# Patient Record
Sex: Female | Born: 1965 | Race: White | Hispanic: No | Marital: Married | Smoking: Former smoker
Health system: Southern US, Community
[De-identification: ages and names within clinical notes are randomized; demographics above are authoritative.]

## PROBLEM LIST (undated history)

## (undated) DIAGNOSIS — G43909 Migraine, unspecified, not intractable, without status migrainosus: Secondary | ICD-10-CM

## (undated) DIAGNOSIS — J45909 Unspecified asthma, uncomplicated: Secondary | ICD-10-CM

## (undated) DIAGNOSIS — K769 Liver disease, unspecified: Secondary | ICD-10-CM

## (undated) DIAGNOSIS — I700 Atherosclerosis of aorta: Secondary | ICD-10-CM

## (undated) DIAGNOSIS — I10 Essential (primary) hypertension: Secondary | ICD-10-CM

## (undated) DIAGNOSIS — T7840XA Allergy, unspecified, initial encounter: Secondary | ICD-10-CM

## (undated) DIAGNOSIS — Z86718 Personal history of other venous thrombosis and embolism: Secondary | ICD-10-CM

## (undated) DIAGNOSIS — E785 Hyperlipidemia, unspecified: Secondary | ICD-10-CM

## (undated) DIAGNOSIS — F419 Anxiety disorder, unspecified: Secondary | ICD-10-CM

## (undated) DIAGNOSIS — K219 Gastro-esophageal reflux disease without esophagitis: Secondary | ICD-10-CM

## (undated) DIAGNOSIS — N390 Urinary tract infection, site not specified: Secondary | ICD-10-CM

## (undated) HISTORY — DX: Anxiety disorder, unspecified: F41.9

## (undated) HISTORY — DX: Urinary tract infection, site not specified: N39.0

## (undated) HISTORY — DX: Personal history of other venous thrombosis and embolism: Z86.718

## (undated) HISTORY — DX: Migraine, unspecified, not intractable, without status migrainosus: G43.909

## (undated) HISTORY — DX: Allergy, unspecified, initial encounter: T78.40XA

---

## 2004-06-29 ENCOUNTER — Ambulatory Visit: Payer: Self-pay | Admitting: Gastroenterology

## 2004-11-24 ENCOUNTER — Ambulatory Visit: Payer: Self-pay | Admitting: Internal Medicine

## 2004-12-07 ENCOUNTER — Ambulatory Visit: Payer: Self-pay | Admitting: Obstetrics and Gynecology

## 2005-12-08 ENCOUNTER — Ambulatory Visit: Payer: Self-pay | Admitting: Obstetrics and Gynecology

## 2006-07-20 ENCOUNTER — Ambulatory Visit: Payer: Self-pay | Admitting: Internal Medicine

## 2006-07-20 ENCOUNTER — Inpatient Hospital Stay: Payer: Self-pay | Admitting: Internal Medicine

## 2006-07-22 ENCOUNTER — Ambulatory Visit: Payer: Self-pay | Admitting: Internal Medicine

## 2006-08-16 ENCOUNTER — Ambulatory Visit: Payer: Self-pay | Admitting: Internal Medicine

## 2006-08-20 ENCOUNTER — Ambulatory Visit: Payer: Self-pay | Admitting: Internal Medicine

## 2006-12-27 ENCOUNTER — Ambulatory Visit: Payer: Self-pay | Admitting: Obstetrics and Gynecology

## 2007-10-19 ENCOUNTER — Emergency Department: Payer: Self-pay | Admitting: Internal Medicine

## 2007-10-19 ENCOUNTER — Other Ambulatory Visit: Payer: Self-pay

## 2008-01-03 ENCOUNTER — Ambulatory Visit: Payer: Self-pay | Admitting: Obstetrics and Gynecology

## 2008-07-18 ENCOUNTER — Ambulatory Visit: Payer: Self-pay | Admitting: Internal Medicine

## 2008-11-22 IMAGING — CT CT ABDOMEN WO/W CM
3 of 5 series · 14 of 32 positions shown, 19 images · non-contrast
Comparison: none

REASON FOR EXAM: Portal Vein thrombosis
COMMENTS:

[Series 2: soft tissue w/o · axial · non-contrast · 0.71mm/px · z∈[-786,-698]mm · 2 of 35 slices shown]
[im 12/35  soft-tissue]
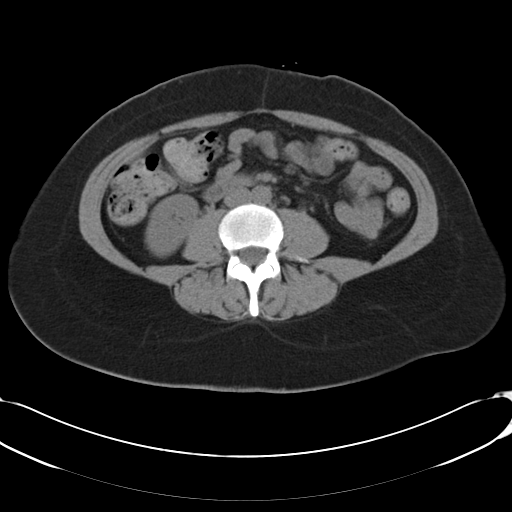
[im 23/35  soft-tissue]
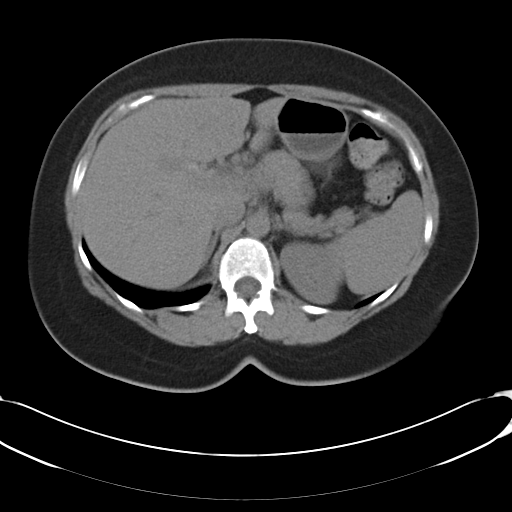

[Series 4: soft tissue with · axial · 0.71mm/px · z∈[-842,-642]mm · 6 of 56 slices shown]
[im 8/56  soft-tissue]
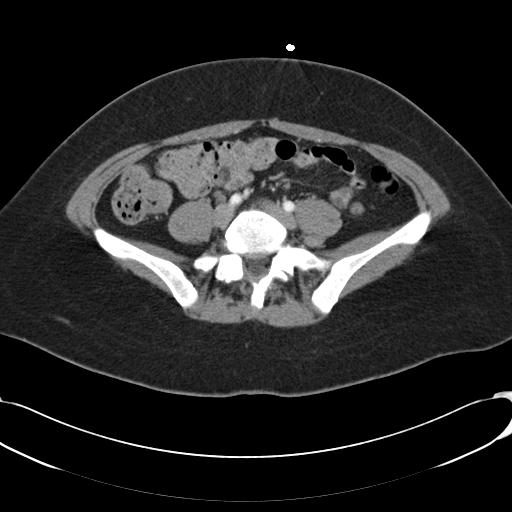
[im 16/56  soft-tissue]
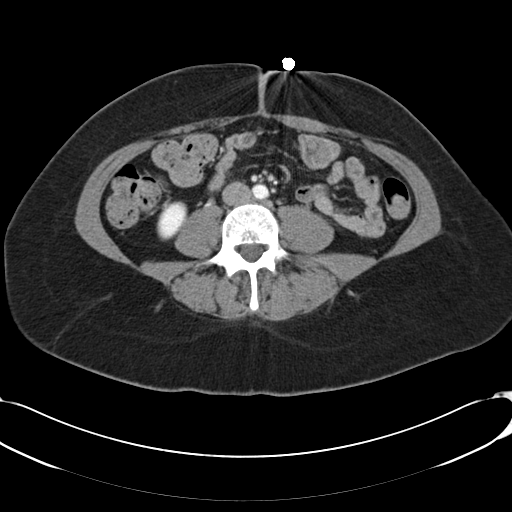
[im 24/56  soft-tissue]
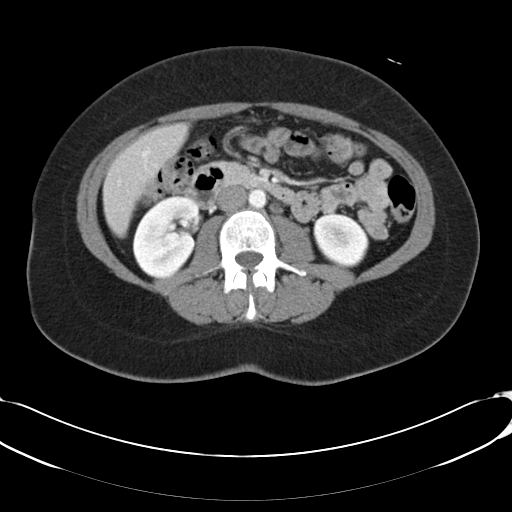
[im 32/56  soft-tissue]
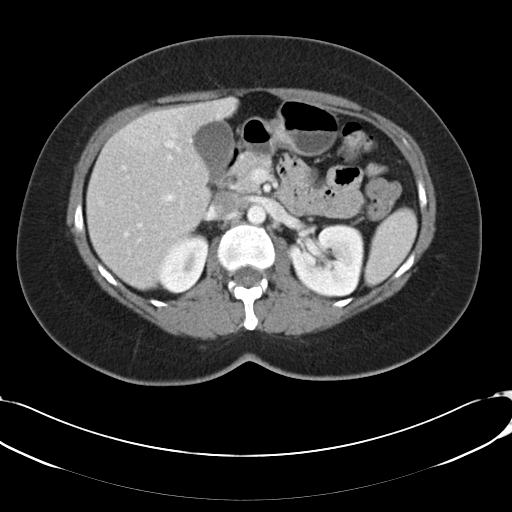
[im 40/56  soft-tissue]
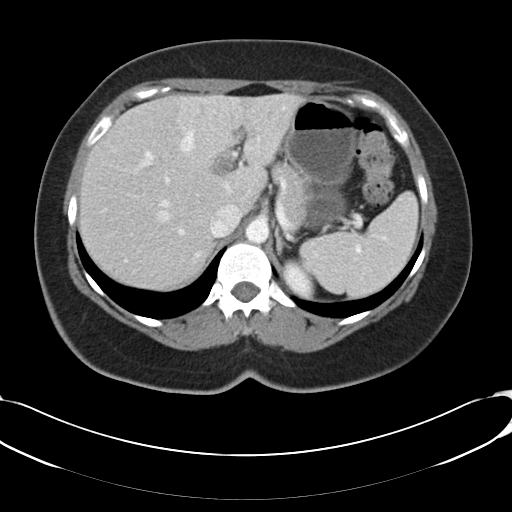
[im 48/56  soft-tissue]
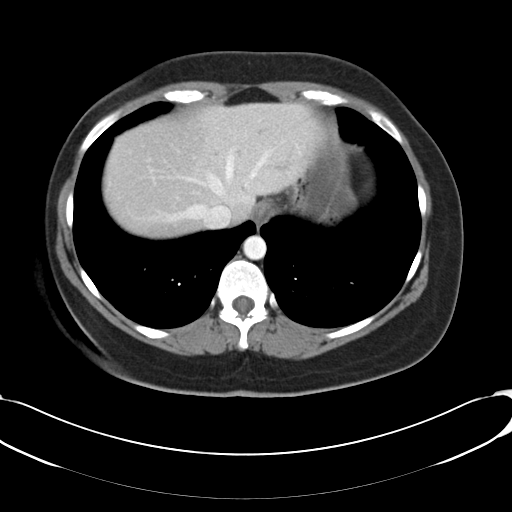

[Series 5: soft tissue with venous · axial · portal-venous · 0.71mm/px · z∈[-842,-642]mm · 6 of 56 slices shown, 11 images]
[im 8/56  soft-tissue]
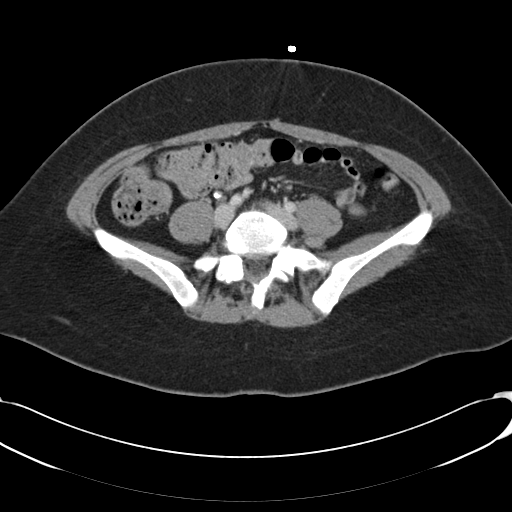
[im 8/56  bone]
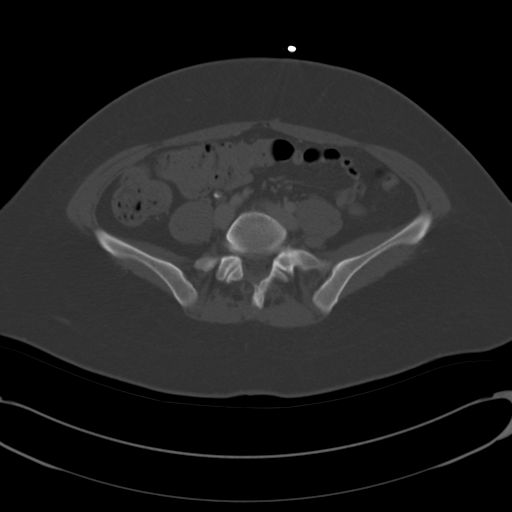
[im 16/56  soft-tissue]
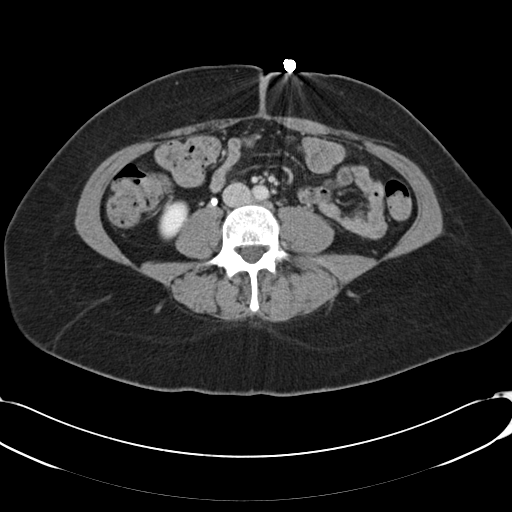
[im 24/56  soft-tissue]
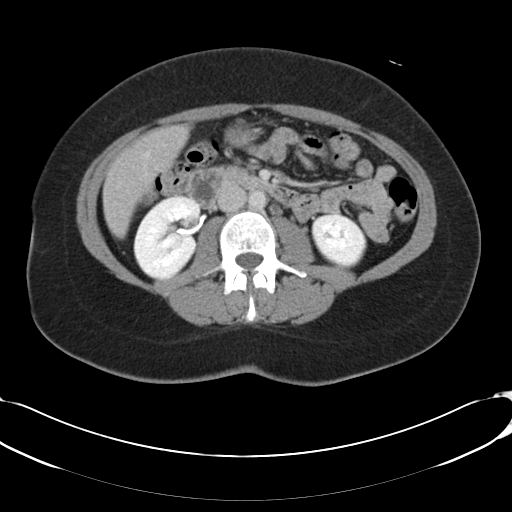
[im 24/56  lung]
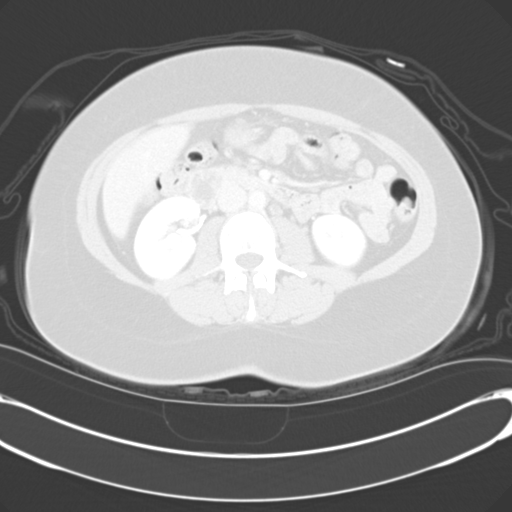
[im 32/56  soft-tissue]
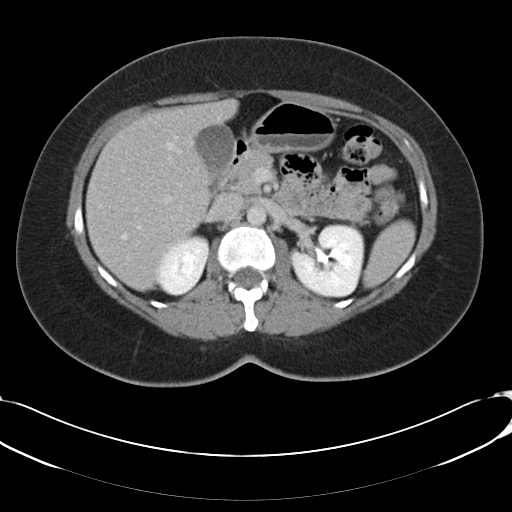
[im 32/56  lung]
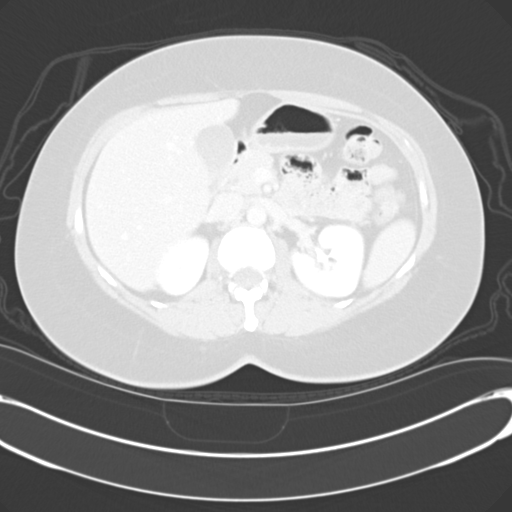
[im 40/56  soft-tissue]
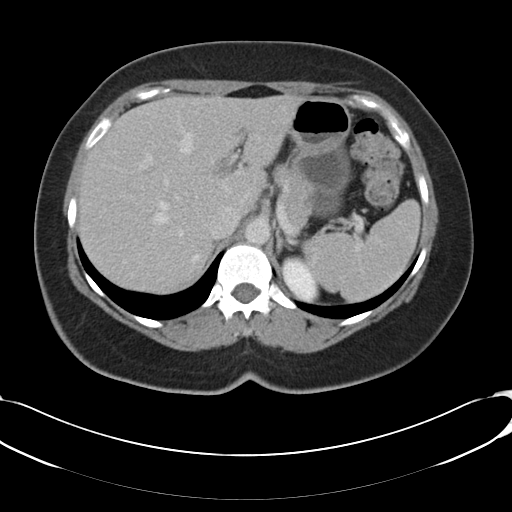
[im 40/56  lung]
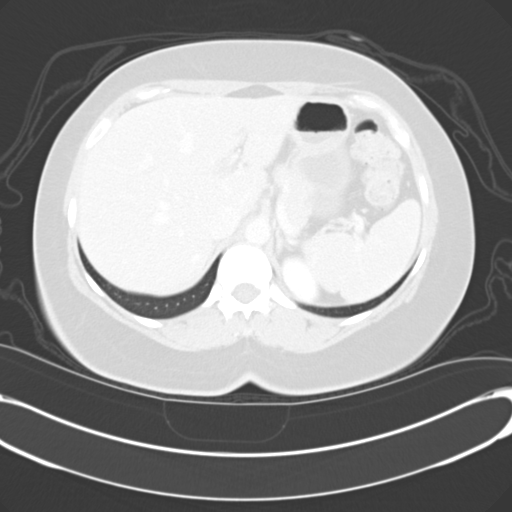
[im 48/56  soft-tissue]
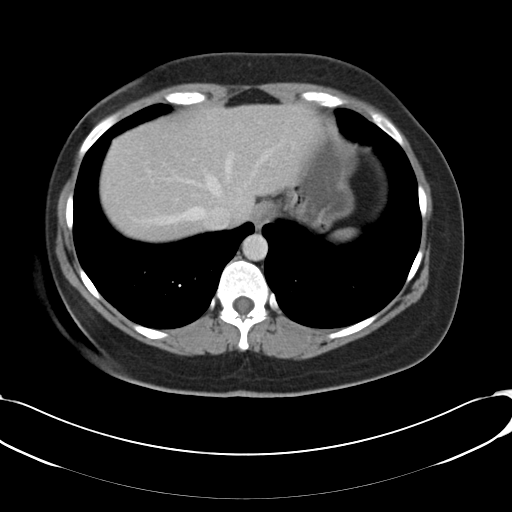
[im 48/56  lung]
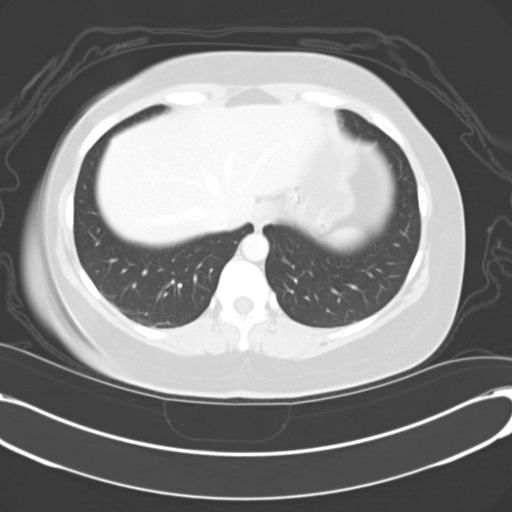

[14 of 32 positions shown; findings below may reference images not displayed]

PROCEDURE:     CT  - CT ABDOMEN STANDARD W/WO  - July 20, 2006 [DATE]

RESULT:        Axial 8 mm sections were obtained from the lung bases through
the superior iliac crest.

Evaluation of the lung bases demonstrates hypoventilation.  No further
gross abnormalities are identified.

Evaluation of the liver demonstrates no evidence of liver masses.  There
does appear to be asymmetric enhancement of the liver with the RIGHT lobe
demonstrating a slightly lower attenuation status post contrast when
compared to the LEFT. Evaluation of the portal vein demonstrates an area of
low attenuation which appears to rest along the wall of the LEFT portal vein
and extends into the intraparenchymal portions of the portal vein on the
LEFT.  The RIGHT portal vein and central portal vein appear to be opacified.
 These findings correlate with the findings on ultrasound consistent with
portal vein thrombosis.  There appears to be areas of cavernous
transformation within the porta hepatis.  No evidence of liver masses are
identified. The kidneys, spleen, adrenals and pancreas are unremarkable.
There is no evidence of abdominal free or drainable fluid collections.  The
celiac, SMA and IMA are patent.
IMPRESSION: 1.     Findings which appear to reflect the sequela of thrombus within the
LEFT branch of the portal vein as well as extending into the porta hepatis
region. There also appears to be evidence of associated cavernous
transformation.
2.     Not mentioned above, there also appears to be evidence of extension
of the region of low attenuation within the proximal portion of the RIGHT
portal vein. Interventional surgical consultation is recommended.

Dr. Blain of the Internal Medicine Service was informed of these findings at
the time of the initial interpretation.

## 2008-11-22 IMAGING — US ABDOMEN ULTRASOUND
1 series · 17 of 25 positions shown · non-contrast
Comparison: none

REASON FOR EXAM: RUQ epigastric abdominal pain
COMMENTS:

[Series 1: abdomen ultrasound · 17 of 102 slices shown]
[im 1/102]
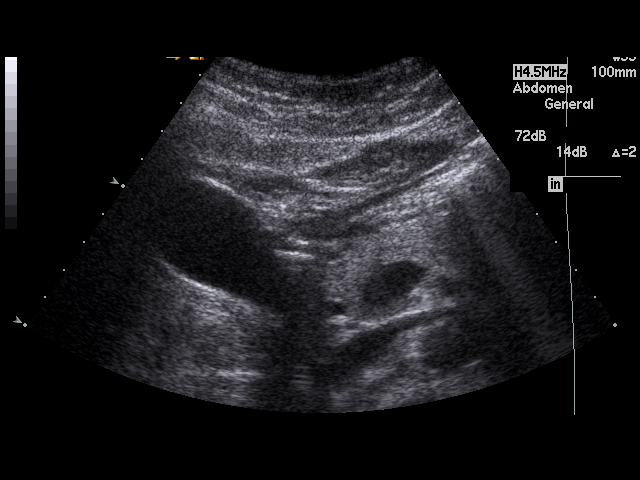
[im 9/102]
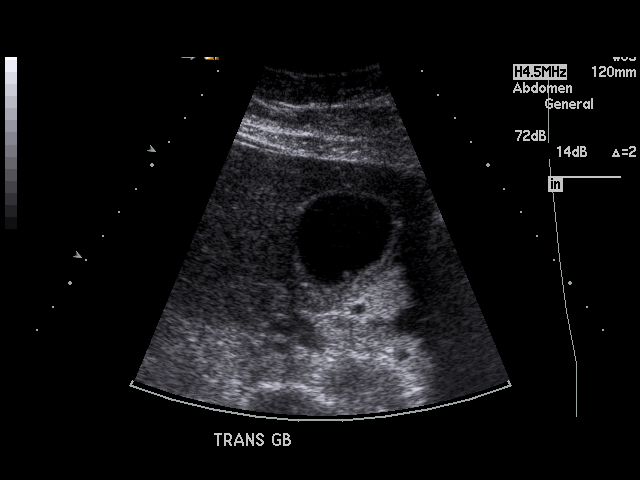
[im 13/102]
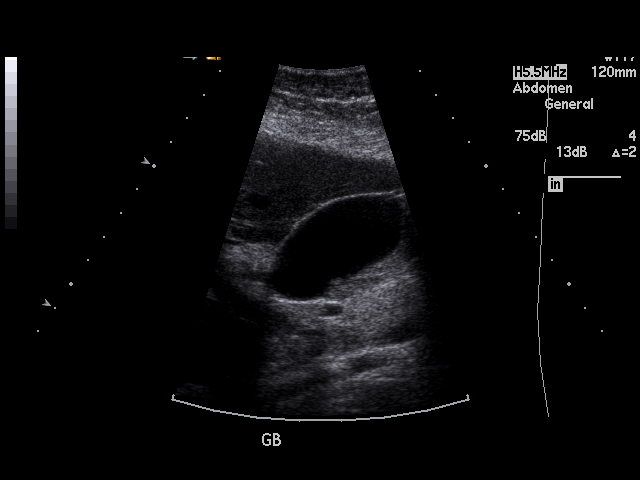
[im 22/102]
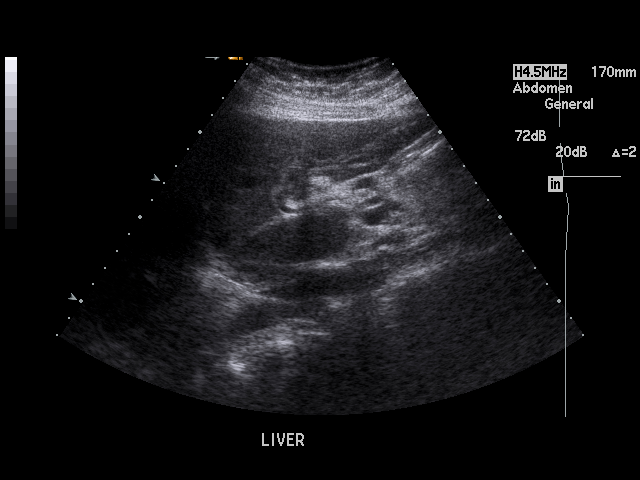
[im 26/102]
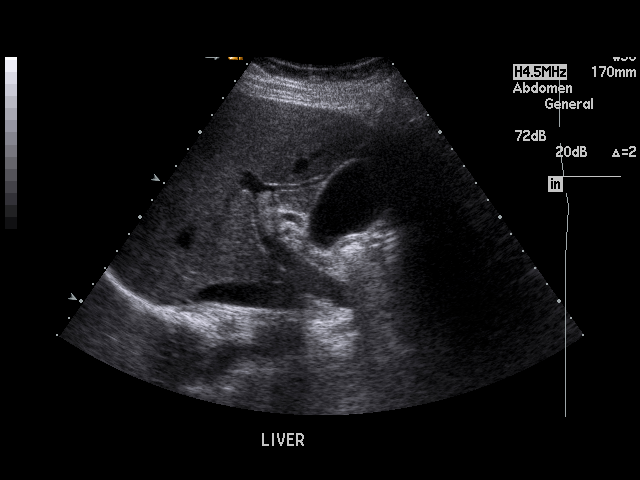
[im 34/102]
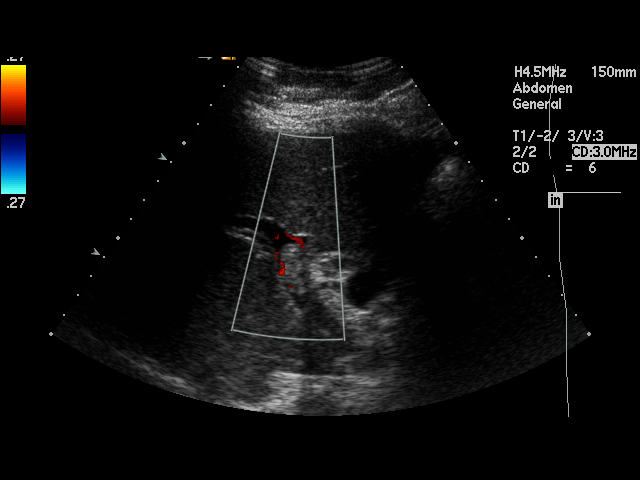
[im 38/102]
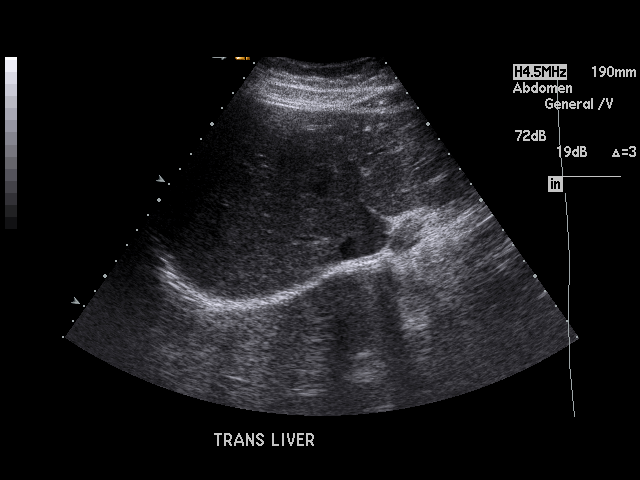
[im 47/102]
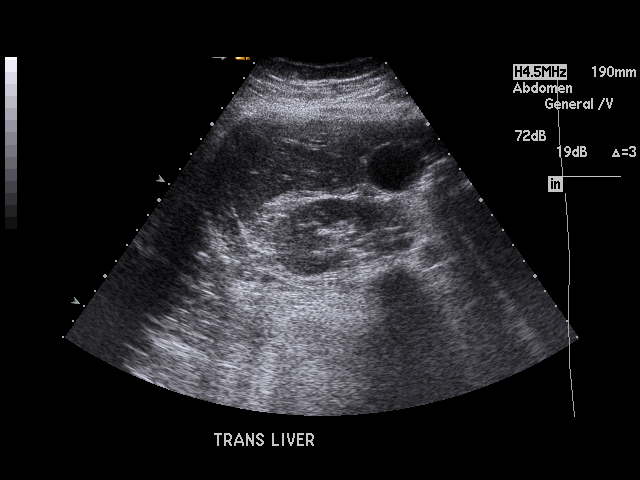
[im 51/102]
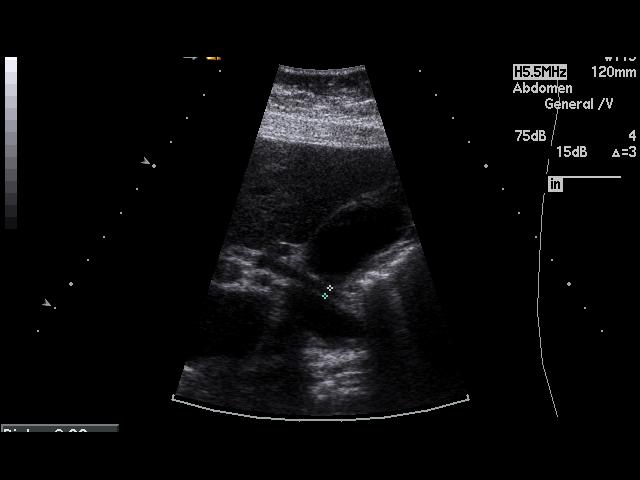
[im 55/102]
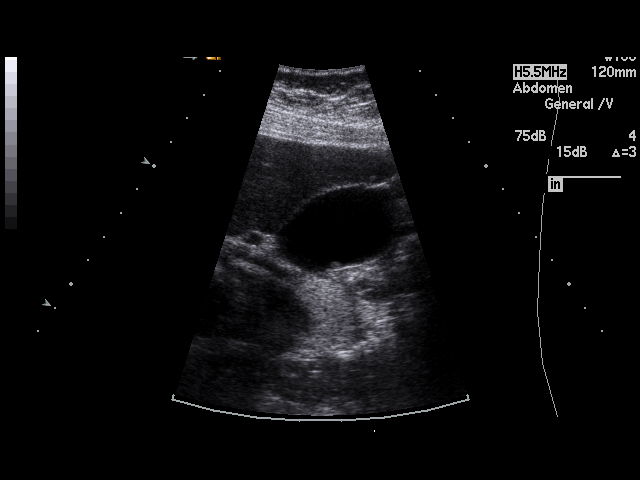
[im 64/102]
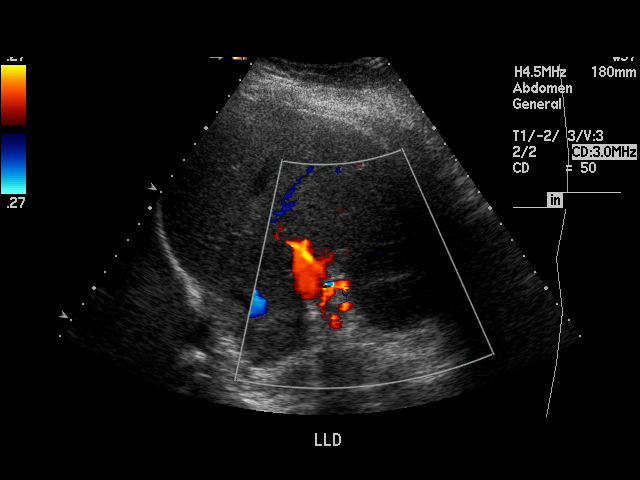
[im 68/102]
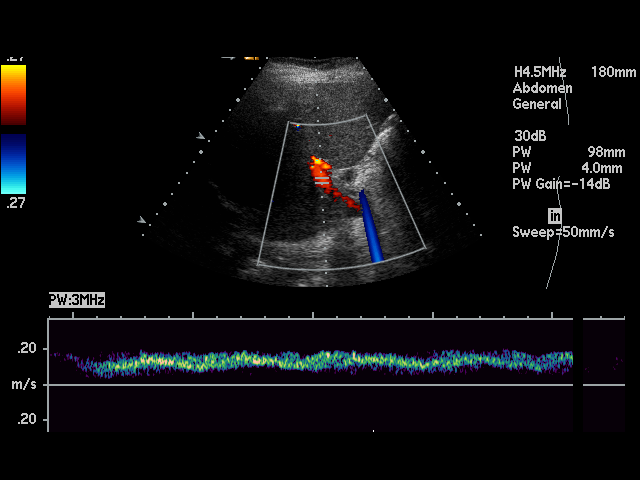
[im 76/102]
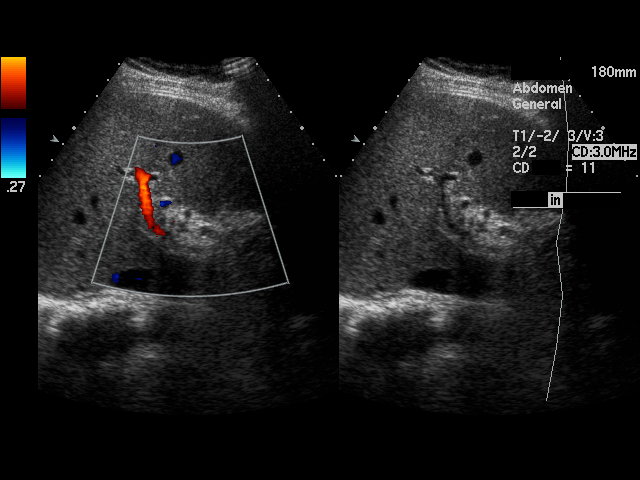
[im 80/102]
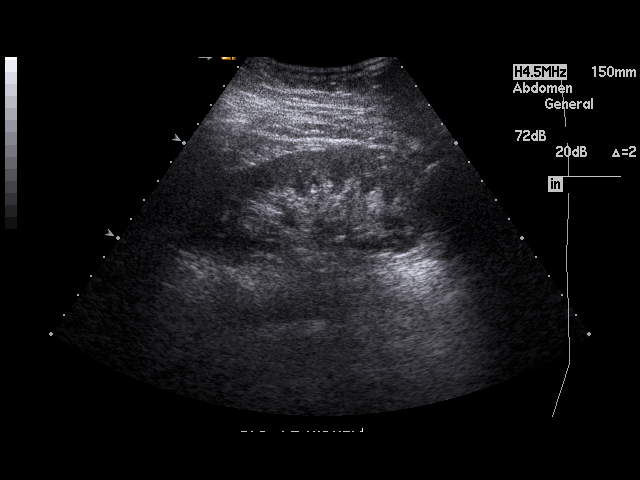
[im 89/102]
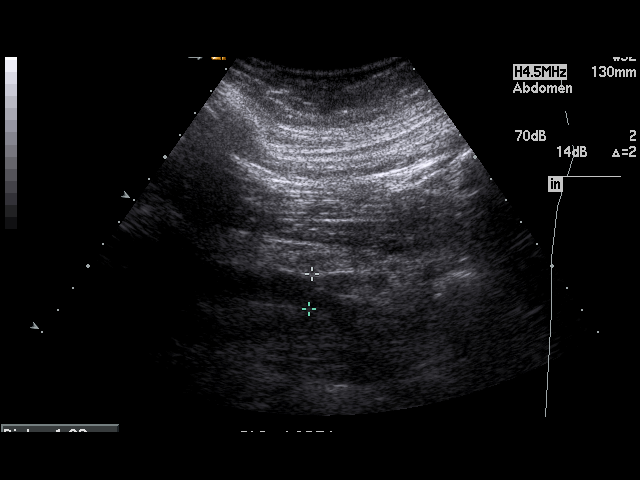
[im 93/102]
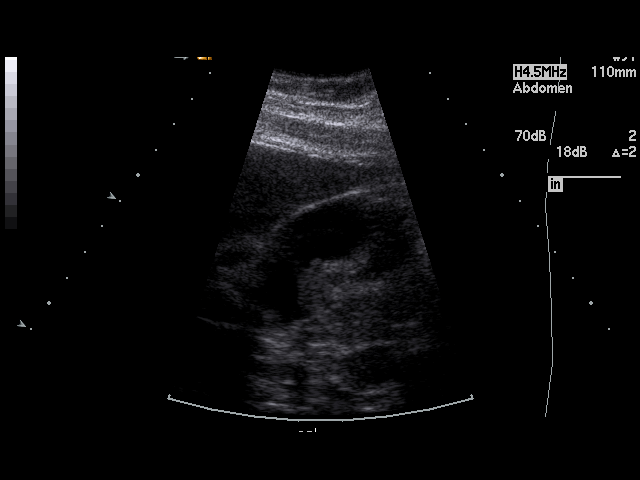
[im 102/102]
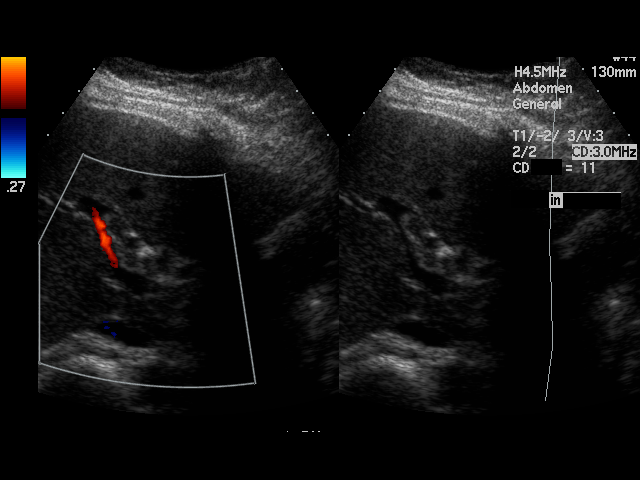

[17 of 25 positions shown; findings below may reference images not displayed]

PROCEDURE:     US  - US ABDOMEN GENERAL SURVEY  - July 20, 2006 [DATE]

RESULT:     Evaluation of the liver demonstrates no focal solid or cystic
masses or regions of abnormal echotexture. There is a region of increased
echogenicity within the midportion of the portal vein, which on Doppler flow
imaging demonstrates areas of flow passing along the periphery of this area
of increased echogenicity. This area is concerning for a possible region of
non-occlusive thrombus and further evaluation with triphasic CT to include a
portal venous phase is recommended. There also appears to be a serpiginous
area of flow adjacent to the portal vein suspicious for a region of
cavernous transformation. Evaluation of the gallbladder demonstrates a focal
small area of increased echogenicity without shadowing which is non-mobile
along the periphery of the gallbladder suspicious for a polyp.  Gallbladder
wall thickness is 1.4 mm. There is no evidence of stones nor sludging. The
common bile duct measures 4 mm in diameter. Evaluation of the aorta
demonstrates no evidence of abdominal aortic aneurysm. The aorta measures
1.6 cm in diameter. The spleen and kidneys demonstrate no sonographic
abnormality. Hepatopetal flow is demonstrated within the portal vein. The
spleen measures 10.7 cm in longitudinal dimensions. The pancreas
demonstrates no sonographic abnormalities.
IMPRESSION: 1.Findings suspicious for a region of non-occlusive thrombus within the main
portal vein as described above.  Further evaluation with triphasic CT is
recommended. These findings were telephoned to Dr. [REDACTED] at the time
of the initial interpretation.

2.No further sonographic abnormality is appreciated.

## 2009-01-14 ENCOUNTER — Ambulatory Visit: Payer: Self-pay | Admitting: Obstetrics and Gynecology

## 2009-06-24 ENCOUNTER — Ambulatory Visit: Payer: Self-pay | Admitting: General Practice

## 2010-02-19 ENCOUNTER — Ambulatory Visit: Payer: Self-pay | Admitting: Obstetrics and Gynecology

## 2010-11-29 ENCOUNTER — Ambulatory Visit: Payer: Self-pay | Admitting: Urology

## 2011-03-02 ENCOUNTER — Ambulatory Visit: Payer: Self-pay | Admitting: Obstetrics and Gynecology

## 2012-03-06 ENCOUNTER — Ambulatory Visit: Payer: Self-pay | Admitting: Obstetrics and Gynecology

## 2013-03-19 ENCOUNTER — Ambulatory Visit: Payer: Self-pay | Admitting: Obstetrics and Gynecology

## 2014-03-21 HISTORY — PX: VAGINAL HYSTERECTOMY: SUR661

## 2014-03-24 ENCOUNTER — Ambulatory Visit: Payer: Self-pay | Admitting: Obstetrics and Gynecology

## 2014-06-02 ENCOUNTER — Ambulatory Visit: Payer: Self-pay | Admitting: Family Medicine

## 2015-02-05 ENCOUNTER — Other Ambulatory Visit: Payer: Self-pay | Admitting: Obstetrics and Gynecology

## 2015-02-05 DIAGNOSIS — Z1231 Encounter for screening mammogram for malignant neoplasm of breast: Secondary | ICD-10-CM

## 2015-02-09 ENCOUNTER — Ambulatory Visit (INDEPENDENT_AMBULATORY_CARE_PROVIDER_SITE_OTHER): Payer: BC Managed Care – PPO | Admitting: Obstetrics and Gynecology

## 2015-02-09 ENCOUNTER — Encounter: Payer: Self-pay | Admitting: Obstetrics and Gynecology

## 2015-02-09 VITALS — BP 125/80 | HR 89 | Ht 60.0 in | Wt 184.4 lb

## 2015-02-09 DIAGNOSIS — Z975 Presence of (intrauterine) contraceptive device: Secondary | ICD-10-CM

## 2015-02-09 DIAGNOSIS — R3129 Other microscopic hematuria: Secondary | ICD-10-CM | POA: Diagnosis not present

## 2015-02-09 LAB — MICROSCOPIC EXAMINATION: Epithelial Cells (non renal): >10 /hpf — ABNORMAL HIGH (ref 0–10)

## 2015-02-09 LAB — URINALYSIS, COMPLETE
Bilirubin, UA: NEGATIVE
Glucose, UA: NEGATIVE
KETONES UA: NEGATIVE
Nitrite, UA: NEGATIVE
UUROB: 0.2 mg/dL (ref 0.2–1.0)
pH, UA: 5.5 (ref 5.0–7.5)

## 2015-02-09 NOTE — Patient Instructions (Signed)
CT Scan A computed tomography (CT) scan is a specialized X-ray scan. It uses X-rays and a computer to make pictures of different areas of your body. A CT scan can offer more detailed information than a regular X-ray exam. The CT scan provides data about internal organs, soft tissue structures, blood vessels, and bones.  The CT scanner is a large machine that takes pictures of your body as you move through the opening.  LET YOUR HEALTH CARE PROVIDER KNOW ABOUT:  Any allergies you have.   All medicines you are taking, including vitamins, herbs, eye drops, creams, and over-the-counter medicines.   Previous problems you or members of your family have had with the use of anesthetics.   Any blood disorders you have.   Previous surgeries you have had.   Medical conditions you have. RISKS AND COMPLICATIONS  Generally, this is a safe procedure. However, as with any procedure, problems can occur. Possible problems include:   An allergic reaction to the contrast material.   Development of cancer from excessive exposure to radiation. The risk of this is small.  BEFORE THE PROCEDURE   The day before the test, stop drinking caffeinated beverages. These include energy drinks, tea, soda, coffee, and hot chocolate.   On the day of the test:  About 4 hours before the test, stop eating and drinking anything but water as advised by your health care provider.   Avoid wearing jewelry. You will have to partly or fully undress and wear a hospital gown. PROCEDURE   You will be asked to lie on a table with your arms above your head.   If contrast dye is to be used for the test, an IV tube will be inserted in your arm. The contrast dye will be injected into the IV tube. You might feel warm, or you may get a metallic taste in your mouth.   The table you will be lying on will move into a large machine that will do the scanning.   You will be able to see, hear, and talk to the person running the  machine while you are in it. Follow that person's directions.   The CT machine will move around you to take pictures. Do not move while it is scanning. This helps to get a good image.   When the best possible pictures have been taken, the machine will be turned off. The table will be moved out of the machine. The IV tube will then be removed. AFTER THE PROCEDURE  Ask your health care provider when to follow up for your test results.   This information is not intended to replace advice given to you by your health care provider. Make sure you discuss any questions you have with your health care provider.   Document Released: 04/14/2004 Document Revised: 03/12/2013 Document Reviewed: 11/12/2012 Elsevier Interactive Patient Education 2016 [employer] Cystoscopy Cystoscopy is a procedure that is used to help your caregiver diagnose and sometimes treat conditions that affect your lower urinary tract. Your lower urinary tract includes your bladder and the tube through which urine passes from your bladder out of your body (urethra). Cystoscopy is performed with a thin, tube-shaped instrument (cystoscope). The cystoscope has lenses and a light at the end so that your caregiver can see inside your bladder. The cystoscope is inserted at the entrance of your urethra. Your caregiver guides it through your urethra and into your bladder. There are two main types of cystoscopy:  Flexible cystoscopy (with a flexible   cystoscope).  Rigid cystoscopy (with a rigid cystoscope). Cystoscopy may be recommended for many conditions, including:  Urinary tract infections.  Blood in your urine (hematuria).  Loss of bladder control (urinary incontinence) or overactive bladder.  Unusual cells found in a urine sample.  Urinary blockage.  Painful urination. Cystoscopy may also be done to remove a sample of your tissue to be checked under a microscope (biopsy). It may also be done to remove or destroy bladder  stones. LET YOUR CAREGIVER KNOW ABOUT:  Allergies to food or medicine.  Medicines taken, including vitamins, herbs, eyedrops, over-the-counter medicines, and creams.  Use of steroids (by mouth or creams).  Previous problems with anesthetics or numbing medicines.  History of bleeding problems or blood clots.  Previous surgery.  Other health problems, including diabetes and kidney problems.  Possibility of pregnancy, if this applies. PROCEDURE The area around the opening to your urethra will be cleaned. A medicine to numb your urethra (local anesthetic) is used. If a tissue sample or stone is removed during the procedure, you may be given a medicine to make you sleep (general anesthetic). Your caregiver will gently insert the tip of the cystoscope into your urethra. The cystoscope will be slowly glided through your urethra and into your bladder. Sterile fluid will flow through the cystoscope and into your bladder. The fluid will expand and stretch your bladder. This gives your caregiver a better view of your bladder walls. The procedure lasts about 15-20 minutes. AFTER THE PROCEDURE If a local anesthetic is used, you will be allowed to go home as soon as you are ready. If a general anesthetic is used, you will be taken to a recovery area until you are stable. You may have temporary bleeding and burning on urination.   This information is not intended to replace advice given to you by your health care provider. Make sure you discuss any questions you have with your health care provider.   Document Released: 03/04/2000 Document Revised: 03/28/2014 Document Reviewed: 08/29/2011 Elsevier Interactive Patient Education 2016 [employer] Hematuria, Adult Hematuria is blood in your urine. It can be caused by a bladder infection, kidney infection, prostate infection, kidney stone, or cancer of your urinary tract. Infections can usually be treated with medicine, and a kidney stone usually will  pass through your urine. If neither of these is the cause of your hematuria, further workup to find out the reason may be needed. It is very important that you tell your health care provider about any blood you see in your urine, even if the blood stops without treatment or happens without causing pain. Blood in your urine that happens and then stops and then happens again can be a symptom of a very serious condition. Also, pain is not a symptom in the initial stages of many urinary cancers. HOME CARE INSTRUCTIONS   Drink lots of fluid, 3-4 quarts a day. If you have been diagnosed with an infection, cranberry juice is especially recommended, in addition to large amounts of water.  Avoid caffeine, tea, and carbonated beverages because they tend to irritate the bladder.  Avoid alcohol because it may irritate the prostate.  Take all medicines as directed by your health care provider.  If you were prescribed an antibiotic medicine, finish it all even if you start to feel better.  If you have been diagnosed with a kidney stone, follow your health care provider's instructions regarding straining your urine to catch the stone.  Empty your bladder often. Avoid holding   urine for long periods of time.  After a bowel movement, women should cleanse front to back. Use each tissue only once.  Empty your bladder before and after sexual intercourse if you are a female. SEEK MEDICAL CARE IF:  You develop back pain.  You have a fever.  You have a feeling of sickness in your stomach (nausea) or vomiting.  Your symptoms are not better in 3 days. Return sooner if you are getting worse. SEEK IMMEDIATE MEDICAL CARE IF:   You develop severe vomiting and are unable to keep the medicine down.  You develop severe back or abdominal pain despite taking your medicines.  You begin passing a large amount of blood or clots in your urine.  You feel extremely weak or faint, or you pass out. MAKE SURE YOU:    Understand these instructions.  Will watch your condition.  Will get help right away if you are not doing well or get worse.   This information is not intended to replace advice given to you by your health care provider. Make sure you discuss any questions you have with your health care provider.   Document Released: 03/07/2005 Document Revised: 03/28/2014 Document Reviewed: 11/05/2012 Elsevier Interactive Patient Education 2016 [employer] Inc[employer]  

## 2015-02-09 NOTE — Progress Notes (Signed)
02/09/2015 7:55 AM   Mikayla Reed 1965-12-28 [id number]  Referring provider: No referring provider defined for this encounter.  Chief Complaint  Patient presents with  . Recurrent UTI    new pt    HPI: Patient is a 49 year old female presenting today as a referral from her GYN provider Dr. Primitivo [facility]au[facility] with complaints of recurrent urinary tract infections. After reviewing her last several urine culture results it seems as though patient did not have any documented infections though she does have recurrent microscopic hematuria.  She reports symptoms of suprapubic pain "cramping" radiating to RLQ at times. No exacerbating or alleviating factors.  No dysuria or gross hematuria.  Some frequency and urgency. Pain first began in March and has occurred intermittently since.    Patient has a Mirena IUD which has been in place for 2 years. During her last pelvic exam IUD strings were unable to be visualized. Patient is scheduled for a pelvic ultrasound tomorrow to determine IUD location/possible migration.  No history renal stones.  Smoker x 30 years 1/2ppd. She is on anticoagulant therapy (Warfarin) for a history of blood clots.  PMH: Past Medical History  Diagnosis Date  . H/O blood clots   . Anxiety   . UTI (lower urinary tract infection)   . Allergy   . Migraines     Surgical History: No past surgical history on file.  Home Medications:    Medication List       This list is accurate as of: 02/09/15 11:59 PM.  Always use your most recent med list.               ALPRAZolam 0.25 MG tablet  Commonly known as:  XANAX  Take by mouth.     B-12 DOTS 500 MCG Tbdp  Generic drug:  Cyanocobalamin  Take by mouth.     buPROPion 150 MG 24 hr tablet  Commonly known as:  WELLBUTRIN XL  1 PO daily     cetirizine 10 MG tablet  Commonly known as:  ZYRTEC  Take by mouth.     cyclobenzaprine 5 MG tablet  Commonly known as:  FLEXERIL  Take by mouth.      esomeprazole 40 MG capsule  Commonly known as:  NEXIUM  Take by mouth.     fluticasone 50 MCG/ACT nasal spray  Commonly known as:  FLONASE  PLACE 2 SPRAYS INTO BOTH NOSTRILS ONCE DAILY.     HYDROcodone-acetaminophen 7.5-325 MG tablet  Commonly known as:  NORCO  Take by mouth.     ketoprofen 75 MG capsule  Commonly known as:  ORUDIS  Take by mouth.     magnesium oxide 400 MG tablet  Commonly known as:  MAG-OX  Take by mouth.     phenazopyridine 200 MG tablet  Commonly known as:  PYRIDIUM  Take by mouth.     VITAMIN D-1000 MAX ST 1000 UNITS tablet  Generic drug:  Cholecalciferol  Take by mouth.     vitamin E 400 UNIT capsule  Take by mouth.     warfarin 5 MG tablet  Commonly known as:  COUMADIN  TAKE 1 AND 1/2 TABLETS (7.5 MG TOTAL) BY MOUTH ONCE DAILY.        Allergies:  Allergies  Allergen Reactions  . Sulfa Antibiotics Hives    Family History: Family History  Problem Relation Age of Onset  . Urolithiasis Neg Hx   . Prostate cancer Neg Hx   . Kidney disease Neg Hx   .  Kidney cancer Neg Hx     Social History:  reports that she has been smoking.  She does not have any smokeless tobacco history on file. She reports that she drinks alcohol. She reports that she does not use illicit drugs.  ROS: UROLOGY Frequent Urination?: No Hard to postpone urination?: Yes Burning/pain with urination?: Yes Get up at night to urinate?: No Leakage of urine?: Yes Urine stream starts and stops?: No Trouble starting stream?: No Do you have to strain to urinate?: No Blood in urine?: Yes Urinary tract infection?: Yes Sexually transmitted disease?: No Injury to kidneys or bladder?: No Painful intercourse?: No Weak stream?: No Currently pregnant?: No Vaginal bleeding?: No Last menstrual period?: n  Gastrointestinal Nausea?: No Vomiting?: No Indigestion/heartburn?: Yes Diarrhea?: Yes Constipation?: No  Constitutional Fever: No Night sweats?: No Weight loss?:  No Fatigue?: No  Skin Skin rash/lesions?: No Itching?: No  Eyes Blurred vision?: No Double vision?: No  Ears/Nose/Throat Sore throat?: No Sinus problems?: Yes  Hematologic/Lymphatic Swollen glands?: No Easy bruising?: Yes  Cardiovascular Leg swelling?: No Chest pain?: No  Respiratory Cough?: No Shortness of breath?: No  Endocrine Excessive thirst?: No  Musculoskeletal Back pain?: Yes Joint pain?: No  Neurological Headaches?: Yes Dizziness?: No  Psychologic Depression?: Yes Anxiety?: Yes  Physical Exam: BP 125/80 mmHg  Pulse 89  Ht 5' (1.524 m)  Wt 184 lb 6.4 oz (83.643 kg)  BMI 36.01 kg/m2  Constitutional:  Alert and oriented, No acute distress. HEENT: [place] AT, moist mucus membranes.  Trachea midline, no masses. Cardiovascular: No clubbing, cyanosis, or edema. Respiratory: Normal respiratory effort, no increased work of breathing. GI: Abdomen is soft, nontender, nondistended, no abdominal masses GU: No CVA tenderness.  Skin: No rashes, bruises or suspicious lesions. Lymph: No cervical or inguinal adenopathy. Neurologic: Grossly intact, no focal deficits, moving all 4 extremities. Psychiatric: Normal mood and affect.  Laboratory Data:   Urinalysis Results for orders placed or performed in visit on 02/09/15  Microscopic Examination  Result Value Ref Range   WBC, UA >30 (H) 0 -  5 /hpf   RBC, UA 3-10 (A) 0 -  2 /hpf   Epithelial Cells (non renal) >10 (H) 0 - 10 /hpf   Mucus, UA Present (A) Not Estab.   Bacteria, UA Many (A) None seen/Few  Urinalysis, Complete  Result Value Ref Range   Specific Gravity, UA >1.030 (H) 1.005 - 1.030   pH, UA 5.5 5.0 - 7.5   Color, UA Yellow Yellow   Appearance Ur Clear Clear   Leukocytes, UA 1+ (A) Negative   Protein, UA Trace (A) Negative/Trace   Glucose, UA Negative Negative   Ketones, UA Negative Negative   RBC, UA 1+ (A) Negative   Bilirubin, UA Negative Negative   Urobilinogen, Ur 0.2 0.2 - 1.0 mg/dL    Nitrite, UA Negative Negative   Microscopic Examination See below:     Pertinent Imaging:   Assessment & Plan:    1. Microscopic Hematuria- recurrent microscopic hematuria noted in UAs over the last year. Negative urine cultures. We discussed the differential diagnosis for microscopic hematuria including nephrolithiasis, renal or upper tract tumors, bladder stones, UTIs, or bladder tumors as well as undetermined etiologies. Per AUA guidelines, I did recommend complete microscopic hematuria evaluation including CTU, possible urine cytology, and office cystoscopy.  Patient would like to defer any further workup until he has followed up with her GYN provider for pelvic ultrasound for evaluation of IUD location. If her IUD require surgical removal  we discussed the possibility of cystoscopy with bilateral retrogrades being performed while patient is under general anesthesia. Patient will be in contact with our office once she receives her pelvic ultrasound results and GYN has a plan. - Urinalysis, Complete  2. Pelvic pain-  intermittent suprapubic pain since March. Negative urine cultures with microscopic hematuria possible IUD migration. No other significant urinary symptoms.  3. IUD Contraception-  Gyn unable to visualize strings.  Possible IUD migration. Pelvic US scheduled tomorrow.   Return in about 3 weeks (around 03/02/2015) for f/u hematuria.  These notes generated with voice recognition software. I apologize for typographical errors.  Herbert Moors, [facility] [address] [place], [place] [address] [phone]

## 2015-02-10 ENCOUNTER — Other Ambulatory Visit: Payer: Self-pay | Admitting: Obstetrics and Gynecology

## 2015-02-10 ENCOUNTER — Telehealth: Payer: Self-pay | Admitting: Obstetrics and Gynecology

## 2015-02-10 DIAGNOSIS — R3129 Other microscopic hematuria: Secondary | ICD-10-CM

## 2015-02-10 NOTE — Telephone Encounter (Signed)
Spoke with patient on the telephone today. She states that her GYN provider Dr. Leafy Ro reviewed her pelvic ultrasound results today and recommended the patient have a hysterectomy. She states that her GYN doctor stated that she recommended that patient pursue a hematuria workup as previously discussed at her last visit with [place]. CT urogram has been ordered. Patient requesting that cystoscopy be performed during her hysterectomy if possible. I spoke with Amy surgery scheduler and she will attempt to have either Dr. Erlene Quan or Dr. Pilar Jarvis performed cystoscopy well patient is under general anesthesia for her hysterectomy.

## 2015-02-12 LAB — CULTURE, URINE COMPREHENSIVE

## 2015-02-23 ENCOUNTER — Telehealth: Payer: Self-pay | Admitting: Obstetrics and Gynecology

## 2015-02-23 ENCOUNTER — Ambulatory Visit
Admission: RE | Admit: 2015-02-23 | Discharge: 2015-02-23 | Disposition: A | Discharge status: Home / Self Care | Payer: BC Managed Care – PPO | Source: Ambulatory Visit | Attending: Obstetrics and Gynecology | Admitting: Obstetrics and Gynecology

## 2015-02-23 DIAGNOSIS — R160 Hepatomegaly, not elsewhere classified: Secondary | ICD-10-CM | POA: Insufficient documentation

## 2015-02-23 DIAGNOSIS — K449 Diaphragmatic hernia without obstruction or gangrene: Secondary | ICD-10-CM | POA: Insufficient documentation

## 2015-02-23 DIAGNOSIS — M4807 Spinal stenosis, lumbosacral region: Secondary | ICD-10-CM | POA: Insufficient documentation

## 2015-02-23 DIAGNOSIS — R3129 Other microscopic hematuria: Secondary | ICD-10-CM | POA: Insufficient documentation

## 2015-02-23 DIAGNOSIS — N83202 Unspecified ovarian cyst, left side: Secondary | ICD-10-CM | POA: Insufficient documentation

## 2015-02-23 MED ORDER — IOHEXOL 300 MG/ML  SOLN
125.0000 mL | Freq: Once | INTRAMUSCULAR | Status: AC | PRN
  Administered 2015-02-23: 125 mL via INTRAVENOUS

## 2015-02-23 NOTE — Telephone Encounter (Signed)
The patient via telephone and reviewed her CT results. 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 I notified her of contracted gallbladder seen. She has been experiencing some right upper quadrant pain and states that she has a GI referral pending. She will follow-up as scheduled for cystoscopy and a more detailed review of her CT report.

## 2015-02-26 NOTE — H&P (Signed)
Patient ID: Mikayla Reed is a 49 y.o. female presenting with Pre Op Consulting  on 02/24/2015  HPI: Hx of chronic pelvic pain. Desires surgical and definitive therapy with IUD removal. Also trigger point in RLQ rectus muscle. Does have bilateral ovarian small complex cysts, 1.41mm septation on left, right with ground glass appearance and layering. Both measuring small. Endometrial stripe 4.53mm. Mirena IUD in place.   CA 125 normal.  Ut wnl iud seen in endometrium Rt complex ov cyst=2.32 cm Lt septated ov cyst= 2.87 cm  Recent visit to urology, who would like to do cystoscope to look for cancer for persistent hematuria. Her pain is midline and suprapubic. They will not be present at this surgery, however.  Pt on anticoagulation warfarin 7.5mg  po daily with hx of portal vein thrombosis, and has migraines with aura.  She is aware that BSO at age 68 may increase her risk for osteoporosis and heart disease, as well as increased vasomotor sx. She has been experiencing them for many years and is ok with this risk.  Past Medical History:  has a past medical history of Asthma without status asthmaticus; Fibrocystic breast disease; GERD (gastroesophageal reflux disease); hypercoagulable state; Hyperlipidemia; and Migraines.  Past Surgical History:  has no past surgical history on file. Family History: family history includes Diabetes mellitus in her maternal grandfather; Heart disease in her maternal grandfather; Hyperlipidemia in her father; Hypertension in her father, maternal grandfather, and maternal grandmother; Migraines in her maternal grandmother and mother; Peripheral vascular disease in her paternal grandmother; Stroke in her maternal grandfather, maternal grandmother, and paternal grandmother. Social History:  reports that she has been smoking Cigarettes. She started smoking about 31 years ago. She has been smoking about 0.50 packs per day. She has never used smokeless tobacco.  She reports that she drinks alcohol. OB/GYN History:  OB History    Gravida Para Term Preterm AB TAB SAB Ectopic Multiple Living   3 2 2  1  1   2       Allergies: is allergic to sulfa (sulfonamide antibiotics). Medications:  Current Outpatient Prescriptions:  . ALPRAZolam (XANAX) 0.25 MG tablet, Take 1 tablet (0.25 mg total) by mouth once daily as needed for Sleep., Disp: 30 tablet, Rfl: 1 . amoxicillin (AMOXIL) 500 MG tablet, Take 1 tablet (500 mg total) by mouth 2 (two) times daily., Disp: 6 tablet, Rfl: 0 . buPROPion (WELLBUTRIN XL) 150 MG XL tablet, 1 PO daily, Disp: 30 tablet, Rfl: 11 . cetirizine (ZYRTEC) 10 MG tablet, Take 10 mg by mouth once daily., Disp: , Rfl:  . cholecalciferol (CHOLECALCIFEROL) 1,000 unit tablet, Take by mouth., Disp: , Rfl:  . cyanocobalamin (B-12 DOTS) 500 MCG tablet, Take 500 mcg by mouth once daily., Disp: , Rfl:  . cyclobenzaprine (FLEXERIL) 5 MG tablet, Take 1 tablet (5 mg total) by mouth once daily as needed for Muscle spasms., Disp: 20 tablet, Rfl: 1 . esomeprazole (NEXIUM) 40 MG DR capsule, Take 1 capsule (40 mg total) by mouth once daily., Disp: 90 capsule, Rfl: 3 . fluticasone (FLONASE) 50 mcg/actuation nasal spray, PLACE 2 SPRAYS INTO BOTH NOSTRILS ONCE DAILY., Disp: 16 g, Rfl: 5 . HYDROcodone-acetaminophen (NORCO) 7.5-325 mg tablet, Take 1 tablet by mouth every 6 (six) hours as needed for Pain., Disp: 60 tablet, Rfl: 0 . ketoprofen (ORUDIS) 75 MG capsule, Take 1 capsule (75 mg total) by mouth 3 (three) times daily as needed for Pain., Disp: 60 capsule, Rfl: 1 . magnesium oxide (MAG-OX) 400 mg tablet, Take  400 mg by mouth once daily., Disp: , Rfl:  . phenazopyridine (PYRIDIUM) 200 MG tablet, Take 1 tablet (200 mg total) by mouth 3 (three) times daily with meals., Disp: 6 tablet, Rfl: 2 . vitamin E 400 UNIT capsule, Take 400 Units by mouth once daily., Disp: , Rfl:  . warfarin (COUMADIN) 5 MG tablet, TAKE 1 AND 1/2 TABLETS (7.5 MG TOTAL) BY  MOUTH ONCE DAILY., Disp: 135 tablet, Rfl: 1  Review of Systems  Constitutional: Negative. Negative for fatigue and fever.  HENT: Negative.  Respiratory: Negative. Negative for shortness of breath.  Cardiovascular: Negative for chest pain and palpitations.  Gastrointestinal: Negative for abdominal pain, constipation and diarrhea.  Endocrine: Negative for cold intolerance.  Genitourinary: Negative for difficulty urinating, dyspareunia, dysuria, frequency, hematuria, menstrual problem, pelvic pain, urgency, vaginal bleeding, vaginal discharge and vaginal pain.  + Pelvic pain  Neurological: Negative for dizziness and light-headedness.  Psychiatric/Behavioral:  No changes in mood    Exam:         Visit Vitals  . BP 143/88  . Pulse 73  . Wt 82.8 kg (182 lb 9.6 oz)  . BMI 34.5 kg/m2   Physical Exam  Constitutional: She is oriented to person, place, and time. She appears well-developed and well-nourished. No distress.  Eyes: No scleral icterus.  Neck: Normal range of motion. Neck supple. No tracheal deviation present. No thyromegaly present.  Cardiovascular: Normal rate, regular rhythm and normal heart sounds.  No murmur heard. Pulmonary/Chest: Effort normal and breath sounds normal. She has no wheezes. She has no rales.  Abdominal: She exhibits no distension and no mass. There is no tenderness. There is no rebound and no guarding.  Genitourinary:  Genitourinary Comments:  Pelvic exam: External: Tanner stage 5, normal female genitalia without lesions or masses Bladder: Normal size without masses or tenderness, well-supported Urethra: No lesions or discharge with palpation. Normal urethral size and location, no prolapse Vagina: normal physiological discharge, without lesions or masses Cervix: normal without lesions or masses Adnexa: normal bimanual exam without masses or fullness Uterus: Normal size and position without masses or tenderness.  Anus/Perineum: Normal external exam   Musculoskeletal: Normal range of motion.  Lymphadenopathy:  She has no cervical adenopathy.  Neurological: She is alert and oriented to person, place, and time.  Skin: Skin is warm and dry.  Psychiatric: She has a normal mood and affect.         Impression:   The primary encounter diagnosis was Complex ovarian cyst. Diagnoses of Pelvic pain in female, Abnormal uterine bleeding (AUB), and Preop examination were also pertinent to this visit.    Plan:   - Pelvic pain with complex ovarian cysts: She requests definitive management of bleeding and ovarian cysts, and IUD removal. Plan for hysterectomy with BSO, cysto and RLQ trigger point injection. 1 to 3 mL doses of 0.25 percent bupivacaine using a long (at least 1 inch) small gauge (27 g) needle.   Patient returns for a preoperative discussion regarding her plans to proceed with surgical treatment of her chronic pelvic pain and AUB by LAVH and BSO and cystoscopy procedure. The patient and I discussed the technical aspects of the procedure including the potential for risks and complications. These include but are not limited to the risk of infection requiring post-operative antibiotics or further procedures. We talked about the risk of injury to adjacent organs including bladder, bowel, ureter, blood vessels or nerves. We talked about the need to convert to an open incision. We talked about  the possible need for blood transfusion. We talked aboutpostop complications such asthromboembolic or cardiopulmonary complications. All of her questions were answered. Her preoperative exam was completed and the appropriate consents were signed. She is scheduled to undergo this procedure in the near future.  Pelvic floor physical therapy recommended postoperatively.  - Chronic anticoagulation: Pt on coumadin for hx of DVT 9 yrs ago, unknown cause. Pt on estrogen-containing contraception at that time, and thinks it was from that. We  will bridge to lovenox for surgery per PCP protocol. I will check with her PCP for followup and management.

## 2015-03-03 ENCOUNTER — Other Ambulatory Visit: Payer: BC Managed Care – PPO

## 2015-03-04 ENCOUNTER — Ambulatory Visit: Payer: BC Managed Care – PPO | Admitting: Obstetrics and Gynecology

## 2015-03-04 ENCOUNTER — Other Ambulatory Visit: Payer: BC Managed Care – PPO

## 2015-03-06 ENCOUNTER — Encounter
Admission: RE | Admit: 2015-03-06 | Discharge: 2015-03-06 | Disposition: A | Discharge status: Home / Self Care | Payer: BC Managed Care – PPO | Source: Ambulatory Visit | Attending: Obstetrics and Gynecology | Admitting: Obstetrics and Gynecology

## 2015-03-06 DIAGNOSIS — Z01812 Encounter for preprocedural laboratory examination: Secondary | ICD-10-CM | POA: Insufficient documentation

## 2015-03-06 DIAGNOSIS — Z01810 Encounter for preprocedural cardiovascular examination: Secondary | ICD-10-CM | POA: Insufficient documentation

## 2015-03-06 HISTORY — DX: Migraine, unspecified, not intractable, without status migrainosus: G43.909

## 2015-03-06 HISTORY — DX: Gastro-esophageal reflux disease without esophagitis: K21.9

## 2015-03-06 HISTORY — DX: Liver disease, unspecified: K76.9

## 2015-03-06 LAB — CBC
HEMATOCRIT: 39.9 % (ref 35.0–47.0)
Hemoglobin: 12.9 g/dL (ref 12.0–16.0)
MCH: 25.4 pg — ABNORMAL LOW (ref 26.0–34.0)
MCHC: 32.4 g/dL (ref 32.0–36.0)
MCV: 78.4 fL — ABNORMAL LOW (ref 80.0–100.0)
PLATELETS: 298 10*3/uL (ref 150–440)
RBC: 5.09 MIL/uL (ref 3.80–5.20)
RDW: 18.8 % — AB (ref 11.5–14.5)
WBC: 10.3 10*3/uL (ref 3.6–11.0)

## 2015-03-06 LAB — TYPE AND SCREEN
ABO/RH(D): A POS
ANTIBODY SCREEN: NEGATIVE

## 2015-03-06 LAB — PROTIME-INR
INR: 3.46
Prothrombin Time: 34.1 seconds — ABNORMAL HIGH (ref 11.4–15.0)

## 2015-03-06 LAB — BASIC METABOLIC PANEL
Anion gap: 7 (ref 5–15)
BUN: 12 mg/dL (ref 6–20)
CO2: 26 mmol/L (ref 22–32)
CREATININE: 0.87 mg/dL (ref 0.44–1.00)
Calcium: 8.6 mg/dL — ABNORMAL LOW (ref 8.9–10.3)
Chloride: 109 mmol/L (ref 101–111)
GFR calc Af Amer: >60 mL/min (ref >60)
GLUCOSE: 124 mg/dL — AB (ref 65–99)
POTASSIUM: 3 mmol/L — AB (ref 3.5–5.1)
SODIUM: 142 mmol/L (ref 135–145)

## 2015-03-06 LAB — ABO/RH: ABO/RH(D): A POS

## 2015-03-06 NOTE — Pre-Procedure Instructions (Addendum)
Called DR Andree Elk regarding pt's history and EKG.  Medical clearance requested.  Notified by phone and fax Dr Elzie Rings office and Dr Migdalia Dk office, [facility] nurse, regarding need for clearance.  Called patient with time of appointment. Tues 03/10/15 @ 3:45pm.

## 2015-03-06 NOTE — Pre-Procedure Instructions (Signed)
Potassium 3.0 faxed request for potassium supplement to Dr Leafy Ro.

## 2015-03-06 NOTE — Patient Instructions (Signed)
  Your procedure is scheduled on: 03/12/15 Fri Report to Day Surgery.2nd floor medical mall To find out your arrival time please call [phone] between 1PM - 3PM on 03/11/15 Thurs  Remember: Instructions that are not followed completely may result in serious medical risk, up to and including death, or upon the discretion of your surgeon and anesthesiologist your surgery may need to be rescheduled.    _x___ 1. Do not eat food or drink liquids after midnight. No gum chewing or hard candies.     _x_ 2. No Alcohol for 24 hours before or after surgery.   ____ 3. Bring all medications with you on the day of surgery if instructed.    __x__ 4. Notify your doctor if there is any change in your medical condition     (cold, fever, infections).     Do not wear jewelry, make-up, hairpins, clips or nail polish.  Do not wear lotions, powders, or perfumes. You may wear deodorant.  Do not shave 48 hours prior to surgery. Men may shave face and neck.  Do not bring valuables to the hospital.    [facility] is not responsible for any belongings or valuables.               Contacts, dentures or bridgework may not be worn into surgery.  Leave your suitcase in the car. After surgery it may be brought to your room.  For patients admitted to the hospital, discharge time is determined by your                treatment team.   Patients discharged the day of surgery will not be allowed to drive home.   Please read over the following fact sheets that you were given:      _x___ Take these medicines the morning of surgery with A SIP OF WATER:    1.ALPRAZolam (XANAX) 0.25 MG tablet   2. esomeprazole (NEXIUM) 40 MG capsule  3.   4.  5.  6.  ____ Fleet Enema (as directed)   _x___ Use CHG Soap as directed  ____ Use inhalers on the day of surgery  ____ Stop metformin 2 days prior to surgery    ____ Take 1/2 of usual insulin dose the night before surgery and none on the morning of surgery.   _x___  Stop Coumadin/Plavix/aspirin on stopping Coumadin today starting Lovenox  _x___ Stop Anti-inflammatories 1 week before surgery may take Tylenol   _x___ Stop supplements until after surgery.  Stop Vitamin E 1 week before surgery  ____ Bring C-Pap to the hospital.

## 2015-03-10 NOTE — Pre-Procedure Instructions (Signed)
EKG sent to anesthesia for review

## 2015-03-10 NOTE — Pre-Procedure Instructions (Signed)
Cleared by Dr Baldemar Lenis low risk 03/09/15

## 2015-03-11 ENCOUNTER — Ambulatory Visit
Admission: RE | Admit: 2015-03-11 | Payer: BC Managed Care – PPO | Source: Ambulatory Visit | Admitting: Obstetrics and Gynecology

## 2015-03-11 ENCOUNTER — Encounter: Admission: RE | Payer: Self-pay | Source: Ambulatory Visit

## 2015-03-11 SURGERY — HYSTERECTOMY, VAGINAL, LAPAROSCOPY-ASSISTED
Anesthesia: Choice

## 2015-03-13 ENCOUNTER — Ambulatory Visit: Payer: BC Managed Care – PPO | Admitting: Anesthesiology

## 2015-03-13 ENCOUNTER — Encounter
Admission: RE | Disposition: A | Discharge status: Home / Self Care | Payer: Self-pay | Source: Ambulatory Visit | Attending: Obstetrics and Gynecology

## 2015-03-13 ENCOUNTER — Encounter: Payer: Self-pay | Admitting: *Deleted

## 2015-03-13 ENCOUNTER — Observation Stay
Admission: RE | Admit: 2015-03-13 | Discharge: 2015-03-14 | Disposition: A | Discharge status: Home / Self Care | Payer: BC Managed Care – PPO | Source: Ambulatory Visit | Attending: Obstetrics and Gynecology | Admitting: Obstetrics and Gynecology

## 2015-03-13 DIAGNOSIS — N736 Female pelvic peritoneal adhesions (postinfective): Secondary | ICD-10-CM | POA: Diagnosis not present

## 2015-03-13 DIAGNOSIS — N029 Recurrent and persistent hematuria with unspecified morphologic changes: Secondary | ICD-10-CM | POA: Insufficient documentation

## 2015-03-13 DIAGNOSIS — Z86718 Personal history of other venous thrombosis and embolism: Secondary | ICD-10-CM | POA: Diagnosis not present

## 2015-03-13 DIAGNOSIS — N939 Abnormal uterine and vaginal bleeding, unspecified: Secondary | ICD-10-CM | POA: Insufficient documentation

## 2015-03-13 DIAGNOSIS — F17210 Nicotine dependence, cigarettes, uncomplicated: Secondary | ICD-10-CM | POA: Diagnosis not present

## 2015-03-13 DIAGNOSIS — E785 Hyperlipidemia, unspecified: Secondary | ICD-10-CM | POA: Insufficient documentation

## 2015-03-13 DIAGNOSIS — N8301 Follicular cyst of right ovary: Secondary | ICD-10-CM | POA: Diagnosis not present

## 2015-03-13 DIAGNOSIS — D251 Intramural leiomyoma of uterus: Secondary | ICD-10-CM | POA: Diagnosis not present

## 2015-03-13 DIAGNOSIS — G8929 Other chronic pain: Secondary | ICD-10-CM | POA: Insufficient documentation

## 2015-03-13 DIAGNOSIS — Z9889 Other specified postprocedural states: Secondary | ICD-10-CM

## 2015-03-13 DIAGNOSIS — Z7901 Long term (current) use of anticoagulants: Secondary | ICD-10-CM | POA: Diagnosis not present

## 2015-03-13 DIAGNOSIS — N83292 Other ovarian cyst, left side: Secondary | ICD-10-CM | POA: Diagnosis not present

## 2015-03-13 DIAGNOSIS — G43109 Migraine with aura, not intractable, without status migrainosus: Secondary | ICD-10-CM | POA: Insufficient documentation

## 2015-03-13 DIAGNOSIS — N83202 Unspecified ovarian cyst, left side: Secondary | ICD-10-CM | POA: Diagnosis not present

## 2015-03-13 DIAGNOSIS — J45909 Unspecified asthma, uncomplicated: Secondary | ICD-10-CM | POA: Diagnosis not present

## 2015-03-13 DIAGNOSIS — R102 Pelvic and perineal pain: Secondary | ICD-10-CM | POA: Insufficient documentation

## 2015-03-13 DIAGNOSIS — N83291 Other ovarian cyst, right side: Secondary | ICD-10-CM | POA: Diagnosis not present

## 2015-03-13 HISTORY — PX: CYSTOSCOPY: SHX5120

## 2015-03-13 HISTORY — PX: LAPAROSCOPIC VAGINAL HYSTERECTOMY WITH SALPINGO OOPHORECTOMY: SHX6681

## 2015-03-13 HISTORY — PX: IUD REMOVAL: SHX5392

## 2015-03-13 LAB — POCT I-STAT 4, (NA,K, GLUC, HGB,HCT)
GLUCOSE: 85 mg/dL (ref 65–99)
HEMATOCRIT: 39 % (ref 36.0–46.0)
HEMOGLOBIN: 13.3 g/dL (ref 12.0–15.0)
POTASSIUM: 4.4 mmol/L (ref 3.5–5.1)
Sodium: 139 mmol/L (ref 135–145)

## 2015-03-13 LAB — PROTIME-INR
INR: 1
PROTHROMBIN TIME: 13.4 s (ref 11.4–15.0)

## 2015-03-13 LAB — POCT PREGNANCY, URINE: Preg Test, Ur: NEGATIVE

## 2015-03-13 SURGERY — HYSTERECTOMY, VAGINAL, LAPAROSCOPY-ASSISTED, WITH SALPINGO-OOPHORECTOMY
Anesthesia: General | Wound class: Clean Contaminated

## 2015-03-13 MED ORDER — MENTHOL 3 MG MT LOZG
1.0000 | LOZENGE | OROMUCOSAL | Status: DC | PRN
Start: 2015-03-13 — End: 2015-03-14

## 2015-03-13 MED ORDER — ROCURONIUM BROMIDE 100 MG/10ML IV SOLN
INTRAVENOUS | Status: DC | PRN
  Administered 2015-03-13: 40 mg via INTRAVENOUS
  Administered 2015-03-13 (×2): 10 mg via INTRAVENOUS

## 2015-03-13 MED ORDER — OXYCODONE-ACETAMINOPHEN 5-325 MG PO TABS: 1.0000 | ORAL_TABLET | ORAL | Status: DC | PRN

## 2015-03-13 MED ORDER — HYDROMORPHONE HCL 1 MG/ML IJ SOLN: 0.2000 mg | INTRAMUSCULAR | Status: DC | PRN

## 2015-03-13 MED ORDER — LORATADINE 10 MG PO TABS
10.0000 mg | ORAL_TABLET | Freq: Every day | ORAL | Status: DC
  Administered 2015-03-13 – 2015-03-14 (×2): 10 mg via ORAL
  Filled 2015-03-13 (×2): qty 1

## 2015-03-13 MED ORDER — CYCLOBENZAPRINE HCL 5 MG PO TABS
5.0000 mg | ORAL_TABLET | Freq: Every day | ORAL | Status: DC | PRN
  Administered 2015-03-13: 5 mg via ORAL
  Filled 2015-03-13 (×2): qty 1

## 2015-03-13 MED ORDER — BUPIVACAINE HCL (PF) 0.5 % IJ SOLN
INTRAMUSCULAR | Status: DC | PRN
  Administered 2015-03-13: 24 mL

## 2015-03-13 MED ORDER — BUPIVACAINE HCL (PF) 0.25 % IJ SOLN
INTRAMUSCULAR | Status: DC | PRN
  Administered 2015-03-13: 9 mL

## 2015-03-13 MED ORDER — SODIUM CHLORIDE 0.9 % IJ SOLN
INTRAMUSCULAR | Status: AC
  Filled 2015-03-13: qty 3

## 2015-03-13 MED ORDER — HYDROMORPHONE HCL 1 MG/ML IJ SOLN
INTRAMUSCULAR | Status: AC
  Administered 2015-03-13: 0.5 mg via INTRAVENOUS
  Filled 2015-03-13: qty 1

## 2015-03-13 MED ORDER — BUPIVACAINE HCL (PF) 0.5 % IJ SOLN
INTRAMUSCULAR | Status: AC
  Filled 2015-03-13: qty 30

## 2015-03-13 MED ORDER — WARFARIN SODIUM 7.5 MG PO TABS
7.5000 mg | ORAL_TABLET | Freq: Every day | ORAL | Status: DC
  Administered 2015-03-14: 7.5 mg via ORAL
  Filled 2015-03-13 (×2): qty 1

## 2015-03-13 MED ORDER — HYDROCODONE-ACETAMINOPHEN 5-325 MG PO TABS
1.0000 | ORAL_TABLET | Freq: Four times a day (QID) | ORAL | Status: DC | PRN
  Administered 2015-03-13: 2 via ORAL
  Administered 2015-03-13: 1 via ORAL
  Administered 2015-03-14 (×2): 2 via ORAL
  Filled 2015-03-13: qty 2
  Filled 2015-03-13 (×2): qty 1
  Filled 2015-03-13: qty 2
  Filled 2015-03-13: qty 1

## 2015-03-13 MED ORDER — LACTATED RINGERS IV SOLN
INTRAVENOUS | Status: DC
  Administered 2015-03-13 – 2015-03-14 (×2): via INTRAVENOUS

## 2015-03-13 MED ORDER — HYDROMORPHONE HCL 1 MG/ML IJ SOLN
0.5000 mg | INTRAMUSCULAR | Status: AC | PRN
  Administered 2015-03-13 (×4): 0.5 mg via INTRAVENOUS

## 2015-03-13 MED ORDER — BUPIVACAINE HCL (PF) 0.25 % IJ SOLN
INTRAMUSCULAR | Status: AC
  Filled 2015-03-13: qty 30

## 2015-03-13 MED ORDER — SODIUM CHLORIDE 0.9 % IJ SOLN
INTRAMUSCULAR | Status: AC
  Filled 2015-03-13: qty 20

## 2015-03-13 MED ORDER — WARFARIN - PHYSICIAN DOSING INPATIENT: Freq: Every day | Status: DC

## 2015-03-13 MED ORDER — CEFAZOLIN SODIUM-DEXTROSE 2-3 GM-% IV SOLR
2.0000 g | INTRAVENOUS | Status: AC
  Administered 2015-03-13: 2 g via INTRAVENOUS

## 2015-03-13 MED ORDER — ONDANSETRON HCL 4 MG PO TABS: 4.0000 mg | ORAL_TABLET | Freq: Four times a day (QID) | ORAL | Status: DC | PRN

## 2015-03-13 MED ORDER — LACTATED RINGERS IV SOLN: INTRAVENOUS | Status: DC

## 2015-03-13 MED ORDER — FLUTICASONE PROPIONATE 50 MCG/ACT NA SUSP
2.0000 | Freq: Every day | NASAL | Status: DC
  Administered 2015-03-13 – 2015-03-14 (×2): 2 via NASAL
  Filled 2015-03-13: qty 16

## 2015-03-13 MED ORDER — DIPHENHYDRAMINE HCL 50 MG/ML IJ SOLN
25.0000 mg | Freq: Three times a day (TID) | INTRAMUSCULAR | Status: DC | PRN
  Administered 2015-03-13: 25 mg via INTRAVENOUS
  Filled 2015-03-13: qty 1

## 2015-03-13 MED ORDER — DEXAMETHASONE SODIUM PHOSPHATE 4 MG/ML IJ SOLN
INTRAMUSCULAR | Status: DC | PRN
  Administered 2015-03-13: 10 mg via INTRAVENOUS

## 2015-03-13 MED ORDER — ALPRAZOLAM 0.5 MG PO TABS
0.2500 mg | ORAL_TABLET | Freq: Two times a day (BID) | ORAL | Status: DC | PRN
  Administered 2015-03-13: 0.25 mg via ORAL
  Filled 2015-03-13: qty 1

## 2015-03-13 MED ORDER — ENOXAPARIN SODIUM 100 MG/ML ~~LOC~~ SOLN
1.0000 mg/kg | Freq: Two times a day (BID) | SUBCUTANEOUS | Status: DC
  Administered 2015-03-14: 85 mg via SUBCUTANEOUS
  Filled 2015-03-13 (×3): qty 1

## 2015-03-13 MED ORDER — LIDOCAINE HCL (CARDIAC) 20 MG/ML IV SOLN
INTRAVENOUS | Status: DC | PRN
  Administered 2015-03-13: 100 mg via INTRAVENOUS

## 2015-03-13 MED ORDER — ONDANSETRON HCL 4 MG/2ML IJ SOLN
INTRAMUSCULAR | Status: DC | PRN
  Administered 2015-03-13: 4 mg via INTRAVENOUS

## 2015-03-13 MED ORDER — ONDANSETRON HCL 4 MG/2ML IJ SOLN: 4.0000 mg | Freq: Four times a day (QID) | INTRAMUSCULAR | Status: DC | PRN

## 2015-03-13 MED ORDER — PANTOPRAZOLE SODIUM 40 MG PO TBEC
40.0000 mg | DELAYED_RELEASE_TABLET | Freq: Every day | ORAL | Status: DC
  Administered 2015-03-13 – 2015-03-14 (×2): 40 mg via ORAL
  Filled 2015-03-13 (×2): qty 1

## 2015-03-13 MED ORDER — WARFARIN - PHARMACIST DOSING INPATIENT: Freq: Every day | Status: DC

## 2015-03-13 MED ORDER — CEFAZOLIN SODIUM 1-5 GM-% IV SOLN
INTRAVENOUS | Status: DC | PRN
  Administered 2015-03-13: 2 g via INTRAVENOUS

## 2015-03-13 MED ORDER — PROPOFOL 10 MG/ML IV BOLUS
INTRAVENOUS | Status: DC | PRN
  Administered 2015-03-13: 150 mg via INTRAVENOUS

## 2015-03-13 MED ORDER — IBUPROFEN 600 MG PO TABS
600.0000 mg | ORAL_TABLET | Freq: Four times a day (QID) | ORAL | Status: DC | PRN
  Administered 2015-03-13 – 2015-03-14 (×4): 600 mg via ORAL
  Filled 2015-03-13 (×4): qty 1

## 2015-03-13 MED ORDER — MIDAZOLAM HCL 2 MG/2ML IJ SOLN
INTRAMUSCULAR | Status: DC | PRN
  Administered 2015-03-13: 2 mg via INTRAVENOUS

## 2015-03-13 MED ORDER — LIDOCAINE-EPINEPHRINE 1 %-1:100000 IJ SOLN
INTRAMUSCULAR | Status: AC
  Filled 2015-03-13: qty 1

## 2015-03-13 MED ORDER — CEFAZOLIN SODIUM-DEXTROSE 2-3 GM-% IV SOLR
INTRAVENOUS | Status: AC
  Filled 2015-03-13: qty 50

## 2015-03-13 MED ORDER — BUPROPION HCL ER (XL) 150 MG PO TB24
150.0000 mg | ORAL_TABLET | Freq: Every day | ORAL | Status: DC
  Administered 2015-03-13: 150 mg via ORAL
  Filled 2015-03-13 (×2): qty 1

## 2015-03-13 MED ORDER — KETOROLAC TROMETHAMINE 30 MG/ML IJ SOLN
INTRAMUSCULAR | Status: AC
  Filled 2015-03-13: qty 1

## 2015-03-13 MED ORDER — DOCUSATE SODIUM 100 MG PO CAPS
100.0000 mg | ORAL_CAPSULE | Freq: Two times a day (BID) | ORAL | Status: DC
  Administered 2015-03-13 – 2015-03-14 (×3): 100 mg via ORAL
  Filled 2015-03-13 (×3): qty 1

## 2015-03-13 MED ORDER — SODIUM CHLORIDE 0.9 % IV SOLN
INTRAVENOUS | Status: DC | PRN
  Administered 2015-03-13: 08:00:00 via INTRAVENOUS

## 2015-03-13 MED ORDER — FENTANYL CITRATE (PF) 100 MCG/2ML IJ SOLN
INTRAMUSCULAR | Status: DC | PRN
  Administered 2015-03-13 (×3): 50 ug via INTRAVENOUS
  Administered 2015-03-13 (×2): 100 ug via INTRAVENOUS

## 2015-03-13 MED ORDER — LACTATED RINGERS IV SOLN
INTRAVENOUS | Status: DC
  Administered 2015-03-13 (×2): via INTRAVENOUS

## 2015-03-13 MED ORDER — SUCCINYLCHOLINE CHLORIDE 20 MG/ML IJ SOLN
INTRAMUSCULAR | Status: DC | PRN
  Administered 2015-03-13: 100 mg via INTRAVENOUS

## 2015-03-13 MED ORDER — HYDROMORPHONE HCL 1 MG/ML IJ SOLN
0.2500 mg | INTRAMUSCULAR | Status: DC | PRN
  Administered 2015-03-13 (×4): 0.5 mg via INTRAVENOUS

## 2015-03-13 MED ORDER — PHENYLEPHRINE HCL 10 MG/ML IJ SOLN
INTRAMUSCULAR | Status: DC | PRN
  Administered 2015-03-13 (×2): 100 ug via INTRAVENOUS

## 2015-03-13 MED ORDER — KETOROLAC TROMETHAMINE 30 MG/ML IJ SOLN
30.0000 mg | Freq: Once | INTRAMUSCULAR | Status: AC
  Administered 2015-03-13: 30 mg via INTRAVENOUS

## 2015-03-13 MED ORDER — SUGAMMADEX SODIUM 200 MG/2ML IV SOLN
INTRAVENOUS | Status: DC | PRN
  Administered 2015-03-13: 165.2 mg via INTRAVENOUS

## 2015-03-13 MED ORDER — ONDANSETRON HCL 4 MG/2ML IJ SOLN: 4.0000 mg | Freq: Once | INTRAMUSCULAR | Status: DC | PRN

## 2015-03-13 SURGICAL SUPPLY — 56 items
BAG URO DRAIN 2000ML W/SPOUT (MISCELLANEOUS) ×5 IMPLANT
CANISTER SUCT 1200ML W/VALVE (MISCELLANEOUS) ×5 IMPLANT
CATH FOLEY 2WAY  5CC 16FR (CATHETERS) ×2
CATH ROBINSON RED A/P 16FR (CATHETERS) ×5 IMPLANT
CATH URTH 16FR FL 2W BLN LF (CATHETERS) ×3 IMPLANT
CLOSURE WOUND 1/2 X4 (GAUZE/BANDAGES/DRESSINGS) ×1
DEFOGGER SCOPE WARMER CLEARIFY (MISCELLANEOUS) ×5 IMPLANT
DRAPE SHEET LG 3/4 BI-LAMINATE (DRAPES) ×5 IMPLANT
DRESSING SURGICEL FIBRLLR 1X2 (HEMOSTASIS) ×3 IMPLANT
DRSG SURGICEL FIBRILLAR 1X2 (HEMOSTASIS) ×5
FILTER LAP SMOKE EVAC STRL (MISCELLANEOUS) IMPLANT
GAUZE PACK 2X3YD (MISCELLANEOUS) ×5 IMPLANT
GLOVE BIO SURGEON STRL SZ 6.5 (GLOVE) ×20 IMPLANT
GLOVE BIO SURGEONS STRL SZ 6.5 (GLOVE) ×5
GLOVE INDICATOR 7.0 STRL GRN (GLOVE) ×25 IMPLANT
GOWN STRL REUS W/ TWL LRG LVL3 (GOWN DISPOSABLE) ×6 IMPLANT
GOWN STRL REUS W/ TWL XL LVL3 (GOWN DISPOSABLE) ×3 IMPLANT
GOWN STRL REUS W/TWL LRG LVL3 (GOWN DISPOSABLE) ×4
GOWN STRL REUS W/TWL XL LVL3 (GOWN DISPOSABLE) ×2
IRRIGATION STRYKERFLOW (MISCELLANEOUS) ×3 IMPLANT
IRRIGATOR STRYKERFLOW (MISCELLANEOUS) ×5
IV LACTATED RINGERS 1000ML (IV SOLUTION) ×5 IMPLANT
KIT RM TURNOVER CYSTO AR (KITS) ×5 IMPLANT
LABEL OR SOLS (LABEL) ×5 IMPLANT
LIGASURE BLUNT 5MM 37CM (INSTRUMENTS) ×5 IMPLANT
LIQUID BAND (GAUZE/BANDAGES/DRESSINGS) ×5 IMPLANT
NDL SAFETY 22GX1.5 (NEEDLE) ×5 IMPLANT
NEEDLE INSUFFLATION 150MM (ENDOMECHANICALS) ×5 IMPLANT
NS IRRIG 500ML POUR BTL (IV SOLUTION) ×5 IMPLANT
PACK BASIN MINOR ARMC (MISCELLANEOUS) ×5 IMPLANT
PACK GYN LAPAROSCOPIC (MISCELLANEOUS) ×5 IMPLANT
PAD GROUND ADULT SPLIT (MISCELLANEOUS) ×5 IMPLANT
PAD OB MATERNITY 4.3X12.25 (PERSONAL CARE ITEMS) ×5 IMPLANT
PAD PREP 24X41 OB/GYN DISP (PERSONAL CARE ITEMS) ×5 IMPLANT
SET CYSTO W/LG BORE CLAMP LF (SET/KITS/TRAYS/PACK) ×5 IMPLANT
SHEARS HARMONIC ACE PLUS 36CM (ENDOMECHANICALS) IMPLANT
SLEEVE ENDOPATH XCEL 5M (ENDOMECHANICALS) ×5 IMPLANT
SPONGE LAP 18X18 5 PK (GAUZE/BANDAGES/DRESSINGS) ×5 IMPLANT
SPONGE XRAY 4X4 16PLY STRL (MISCELLANEOUS) ×5 IMPLANT
STRIP CLOSURE SKIN 1/2X4 (GAUZE/BANDAGES/DRESSINGS) ×4 IMPLANT
SURGILUBE 2OZ TUBE FLIPTOP (MISCELLANEOUS) ×5 IMPLANT
SUT MNCRL 4-0 (SUTURE) ×4
SUT MNCRL 4-0 27XMFL (SUTURE) ×6
SUT PDS AB 2-0 CT1 27 (SUTURE) ×5 IMPLANT
SUT VIC AB 0 CT1 27 (SUTURE) ×2
SUT VIC AB 0 CT1 27XCR 8 STRN (SUTURE) ×3 IMPLANT
SUT VIC AB 0 CT1 36 (SUTURE) ×10 IMPLANT
SUT VIC AB 2-0 UR6 27 (SUTURE) ×10 IMPLANT
SUT VIC AB 4-0 FS2 27 (SUTURE) IMPLANT
SUTURE MNCRL 4-0 27XMF (SUTURE) ×6 IMPLANT
SYR CONTROL 10ML (SYRINGE) ×5 IMPLANT
SYRINGE 10CC LL (SYRINGE) ×5 IMPLANT
TROCAR 12M 150ML BLUNT (TROCAR) ×5 IMPLANT
TROCAR ENDO BLADELESS 11MM (ENDOMECHANICALS) ×5 IMPLANT
TROCAR XCEL NON-BLD 5MMX100MML (ENDOMECHANICALS) ×5 IMPLANT
TUBING INSUFFLATOR HEATED (MISCELLANEOUS) ×5 IMPLANT

## 2015-03-13 NOTE — Op Note (Signed)
Mikayla Reed PROCEDURE DATE: 03/13/2015   PREOPERATIVE DIAGNOSIS: Chronic pelvic pain, recurrent ovarian cysts, rectus trigger point, IUD in situ, persistent hematuria, prior DVT on chronic anticoagulation POSTOPERATIVE DIAGNOSIS: The same + small pelvic adhesions PROCEDURE: Removal of IUD; Total laparoscopic hysterectomy with bilateral salpingoophorectomy; trigger point injection of right rectus muscle; cystourethroscopy; lysis of adhesions SURGEON:  Dr. Viviann Spare ASSISTANT: Dr. Joylene Igo, MD  INDICATIONS: 49 y.o. G3P2 with aforementioned preoperative diagnoses here today for definitive surgical management.   Risks of surgery were discussed with the patient including but not limited to: bleeding which may require transfusion or reoperation; infection which may require antibiotics; injury to bowel, bladder, ureters or other surrounding organs; need for additional procedures including laparotomy; thromboembolic phenomenon, incisional problems and other postoperative/anesthesia complications. Written informed consent was obtained.    FINDINGS:  Small uterus, normal adnexa bilaterally. Left ovarian cyst. Left adhesions between pelvic sidewall and left rectosigmoid. No evidence of endometriosis, adhesions or any other abdominal/pelvic abnormality.  Normal upper abdomen. No evidence of intravesicular lesions or growths. Good efflux bilaterally from ureteral openings.  ANESTHESIA:    General INTRAVENOUS FLUIDS: 1200 ml ESTIMATED BLOOD LOSS: 50 ml Urine output: 700 SPECIMENS: Uterus, cervix, bilateral fallopian tubes and ovaries, IUD, pelvic washings. COMPLICATIONS: None immediate  PROCEDURE IN DETAIL:  The patient received intravenous antibiotics and had sequential compression devices applied to her lower extremities while in the preoperative area.  She was then taken to the operating room where general anesthesia was administered and was found to be adequate.  She was placed in  the dorsal lithotomy position, and was prepped and draped in a sterile manner.  A Foley catheter was inserted into her bladder and attached to gravity drainage and a uterine manipulator was then advanced into the uterus.  Her IUD strings were grasped and removed.  After an adequate timeout was performed, attention was turned to the abdomen. 8mL of 0.25% bupivicaine was injected into the previously located rectus trigger point. Attention was turned to the abdomen, where a infraumbilical incision was made with the scalpel at the patient's request to avoid the umbilicus.  A Veress needle was used to insufflate the abdomen, with a low opening pressure confirmed. The abdomen was then insufflated with carbon dioxide gas and adequate pneumoperitoneum was obtained. A 10-mm trocar and sleeve were then advanced without difficulty with the laparoscope under direct visualization into the abdomen.  Bilateral 5-mm lower quadrant ports were then placed under direct visualization.  A survey of the patient's pelvis and abdomen revealed the findings as above.  Left rectosigmoid adhesions were removed bluntly and with bipolar cautery to free the left IP. The round ligaments were clamped and transected with the Ligasure device on both sides. The bilateral infundibulopelvic ligaments were also clamped and transected with the Ligasure device.  The bladder peritoneum was dissected away from the vaginal cuff. The uterus was followed to the uterine arteries, which were clamped, cauterized, and transected with the Ligasure device.  The colpotomy was performed against the V-care cup with monopolar scissors. Bleeding was noted at the vaginal cuff at 3 o'clock and was controlled with ligasure and monopolar cautery, being careful not to extend laterally. Excellent hemostasis was noted. The specimen was removed through the vagina.  Attention was then turned to her pelvis.    Because of the deep vagina, the vaginal peritoneum was unable to  be closed with 0-Vicryl suture. The vaginal cuff was then closed in a running locked fashion with 0 Vicryl  with care given to incorporate the uterosacral pedicles bilaterally, though this was a difficult proposition. The posterior vaginal cuff was closed with 2-0 PDS for increased visualization.    The Foley catheter was temporarily removed and an uncomplicated cystoscopy was performed. Excellent efflux was noted from both ureteral orifices. The bladder mucosa was intact. The Foley catheter was replaced for postoperative care. Pictures were taken throughout.  All instruments were then removed from the pelvis.  Attention was then returned to her abdomen which was insufflated again with carbon dioxide gas.  The laparoscope was used to survey the operative site, and it was found to be gently oozy. This was controlled with Fibrular for excellent hemostasis. No intraoperative injury to other surrounding organs was noted. The abdomen was desufflated and all instruments were then removed from the patient's abdomen.     All skin incisions were closed with Dermabond. The 15mm port site fascia was unable to be fully extended by fingertip and was not closed. The patient tolerated the procedures well.  All instruments, needles, and sponge counts were correct x 2. The patient was taken to the recovery room awake, extubated and in stable condition.

## 2015-03-13 NOTE — Interval H&P Note (Signed)
History and Physical Interval Note:  03/13/2015 7:47 AM  Mikayla Reed  has presented today for surgery, with the diagnosis of CHRONIC PELVIC PAIN, recurrent ovarian cysts, abnormal uterine bleeding, and persistent hematuria. The various methods of treatment have been discussed with the patient and family. After consideration of risks, benefits and other options for treatment, the patient has consented to  Procedure(s): LAPAROSCOPIC ASSISTED VAGINAL HYSTERECTOMY WITH SALPINGO OOPHORECTOMY (Bilateral) CYSTOSCOPY (N/A) and trigger point injection along right rectus as a surgical intervention .  The patient's history has been reviewed, patient examined, no change in status, stable for surgery.  I have reviewed the patient's chart and labs.  Questions were answered to the patient's satisfaction.     Benjaman Kindler

## 2015-03-13 NOTE — Interval H&P Note (Signed)
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 She took her last dose 7 days ago and is being bridged with 1mg /kg LMWH. Her INR is 1.0 today. We will restart her LMWH and warfarin tomorrow morning, and she will start rechecking INR in 1 week with a goal of 2.5-3.   Benjaman Kindler

## 2015-03-13 NOTE — Anesthesia Preprocedure Evaluation (Signed)
Anesthesia Evaluation  Patient identified by MRN, date of birth, ID band Patient awake    Reviewed: Allergy & Precautions, NPO status , Patient's Chart, lab work & pertinent test results  Airway Mallampati: I  TM Distance: >3 FB Neck ROM: Full    Dental  (+) Teeth Intact   Pulmonary former smoker,    Pulmonary exam normal        Cardiovascular Exercise Tolerance: Good Normal cardiovascular exam     Neuro/Psych    GI/Hepatic GERD  Medicated and Controlled,  Endo/Other    Renal/GU      Musculoskeletal   Abdominal (+) + obese,  Abdomen: soft.    Peds  Hematology Hx of clots--portal vein with liver damage. Will be on life long anti-coagulation.   Anesthesia Other Findings   Reproductive/Obstetrics                             Anesthesia Physical Anesthesia Plan  ASA: III  Anesthesia Plan: General   Post-op Pain Management:    Induction: Intravenous  Airway Management Planned: Oral ETT  Additional Equipment:   Intra-op Plan:   Post-operative Plan: Extubation in OR  Informed Consent: I have reviewed the patients History and Physical, chart, labs and discussed the procedure including the risks, benefits and alternatives for the proposed anesthesia with the patient or authorized representative who has indicated his/her understanding and acceptance.     Plan Discussed with: CRNA  Anesthesia Plan Comments: (INR was 3.46 12/16. Being repeated this morning. Lovenox bridge.)        Anesthesia Quick Evaluation

## 2015-03-13 NOTE — Transfer of Care (Signed)
Immediate Anesthesia Transfer of Care Note  Patient: Mikayla Reed  Procedure(s) Performed: Procedure(s): LAPAROSCOPIC ASSISTED VAGINAL HYSTERECTOMY WITH SALPINGO OOPHORECTOMY, TRIGGER POINT INJECTION (Bilateral) CYSTOSCOPY/URETHROSCOPY (N/A) INTRAUTERINE DEVICE (IUD) REMOVAL  Patient Location: PACU  Anesthesia Type:General  Level of Consciousness: awake, alert  and oriented  Airway & Oxygen Therapy: Patient Spontanous Breathing and Patient connected to face mask oxygen  Post-op Assessment: Report given to RN and Post -op Vital signs reviewed and stable  Post vital signs: Reviewed and stable  Last Vitals:  Filed Vitals:   03/13/15 0705  BP: 150/79  Pulse: 79  Temp: 36.5 C  Resp: 18    Complications: No apparent anesthesia complications

## 2015-03-13 NOTE — Anesthesia Postprocedure Evaluation (Signed)
Anesthesia Post Note  Patient: Mikayla Reed  Procedure(s) Performed: Procedure(s) (LRB): LAPAROSCOPIC ASSISTED VAGINAL HYSTERECTOMY WITH SALPINGO OOPHORECTOMY, TRIGGER POINT INJECTION (Bilateral) CYSTOSCOPY/URETHROSCOPY (N/A) INTRAUTERINE DEVICE (IUD) REMOVAL  Patient location during evaluation: PACU Anesthesia Type: General Level of consciousness: awake Pain management: pain level controlled Vital Signs Assessment: post-procedure vital signs reviewed and stable Respiratory status: spontaneous breathing Cardiovascular status: blood pressure returned to baseline Postop Assessment: no headache Anesthetic complications: no    Last Vitals:  Filed Vitals:   03/13/15 1223 03/13/15 1302  BP: 123/71 140/75  Pulse: 63 73  Temp: 36.7 C 36.9 C  Resp: 16 16    Last Pain:  Filed Vitals:   03/13/15 1303  PainSc: 3                  Scott Fix M

## 2015-03-14 DIAGNOSIS — D251 Intramural leiomyoma of uterus: Secondary | ICD-10-CM | POA: Diagnosis not present

## 2015-03-14 LAB — BASIC METABOLIC PANEL
ANION GAP: 3 — AB (ref 5–15)
BUN: 11 mg/dL (ref 6–20)
CO2: 28 mmol/L (ref 22–32)
Calcium: 8.2 mg/dL — ABNORMAL LOW (ref 8.9–10.3)
Chloride: 108 mmol/L (ref 101–111)
Creatinine, Ser: 0.75 mg/dL (ref 0.44–1.00)
GFR calc Af Amer: >60 mL/min (ref >60)
GFR calc non Af Amer: >60 mL/min (ref >60)
GLUCOSE: 123 mg/dL — AB (ref 65–99)
POTASSIUM: 4.4 mmol/L (ref 3.5–5.1)
Sodium: 139 mmol/L (ref 135–145)

## 2015-03-14 LAB — CBC
HEMATOCRIT: 33.4 % — AB (ref 35.0–47.0)
Hemoglobin: 10.6 g/dL — ABNORMAL LOW (ref 12.0–16.0)
MCH: 25 pg — AB (ref 26.0–34.0)
MCHC: 31.6 g/dL — ABNORMAL LOW (ref 32.0–36.0)
MCV: 79.1 fL — AB (ref 80.0–100.0)
PLATELETS: 243 10*3/uL (ref 150–440)
RBC: 4.22 MIL/uL (ref 3.80–5.20)
RDW: 19.1 % — ABNORMAL HIGH (ref 11.5–14.5)
WBC: 15.3 10*3/uL — AB (ref 3.6–11.0)

## 2015-03-14 LAB — PROTIME-INR
INR: 1.08
Prothrombin Time: 14.2 seconds (ref 11.4–15.0)

## 2015-03-14 MED ORDER — HYDROCODONE-ACETAMINOPHEN 5-325 MG PO TABS: 1.0000 | ORAL_TABLET | Freq: Four times a day (QID) | ORAL | Status: AC | PRN

## 2015-03-14 MED ORDER — DOCUSATE SODIUM 100 MG PO CAPS: 100.0000 mg | ORAL_CAPSULE | Freq: Two times a day (BID) | ORAL | Status: AC

## 2015-03-14 MED ORDER — FERROUS SULFATE 325 (65 FE) MG PO TABS: 325.0000 mg | ORAL_TABLET | Freq: Every day | ORAL | Status: DC

## 2015-03-14 MED ORDER — ONDANSETRON HCL 4 MG PO TABS: 4.0000 mg | ORAL_TABLET | Freq: Four times a day (QID) | ORAL | Status: DC | PRN

## 2015-03-14 MED ORDER — VITAMIN C 250 MG PO TABS: 250.0000 mg | ORAL_TABLET | Freq: Every day | ORAL | Status: AC

## 2015-03-14 NOTE — Discharge Summary (Signed)
Physician Discharge Summary  Patient ID: Mikayla Reed MRN: [id number] DOB/AGE: 06-21-1965 49 y.o.  Admit date: 03/13/2015 Discharge date: 03/14/2015  Admission Diagnoses:  Discharge Diagnoses:  Active Problems:   Postoperative state   Discharged Condition: good, hgb 10. Iron given.  Hospital Course: Pt admitted after TLH, BSO for chronic pelvic pain and ovarian cysts. On pOD#1, she was ambulating without assistance or dizziness, tolerating a regular po diet, pain was controlled with po meds, voiding spontaneously.  Consults: None  Significant Diagnostic Studies: none  Discharge Exam: Blood pressure 118/74, pulse 74, temperature 98.3 F (36.8 C), temperature source Oral, resp. rate 20, height 5' (1.524 m), weight 82.555 kg (182 lb), SpO2 97 %. General appearance: alert, cooperative, appears stated age and no distress Resp: rales base - left Cardio: regular rate and rhythm, S1, S2 normal, no murmur, click, rub or gallop GI: soft, non-tender; bowel sounds normal; no masses,  no organomegaly Extremities: extremities normal, atraumatic, no cyanosis or edema Pulses: 2+ and symmetric Skin: Skin color, texture, turgor normal. No rashes or lesions Neurologic: Grossly normal Incision/Wound: clead dry and intact x4  Disposition:      Medication List    TAKE these medications        ALPRAZolam 0.25 MG tablet  Commonly known as:  XANAX  Take 0.25 mg by mouth 2 (two) times daily as needed.     B-12 DOTS 500 MCG Tbdp  Generic drug:  Cyanocobalamin  Take by mouth.     buPROPion 150 MG 24 hr tablet  Commonly known as:  WELLBUTRIN XL  1 PO daily     cetirizine 10 MG tablet  Commonly known as:  ZYRTEC  Take by mouth.     cyclobenzaprine 5 MG tablet  Commonly known as:  FLEXERIL  Take 5 mg by mouth daily as needed.     docusate sodium 100 MG capsule  Commonly known as:  COLACE  Take 1 capsule (100 mg total) by mouth 2 (two) times daily.     esomeprazole 40 MG  capsule  Commonly known as:  NEXIUM  Take by mouth.     ferrous sulfate 325 (65 FE) MG tablet  Commonly known as:  FERROUSUL  Take 1 tablet (325 mg total) by mouth daily with breakfast.     fluticasone 50 MCG/ACT nasal spray  Commonly known as:  FLONASE  PLACE 2 SPRAYS INTO BOTH NOSTRILS ONCE DAILY.     HYDROcodone-acetaminophen 7.5-325 MG tablet  Commonly known as:  NORCO  Take by mouth.     HYDROcodone-acetaminophen 5-325 MG tablet  Commonly known as:  NORCO/VICODIN  Take 1-2 tablets by mouth every 6 (six) hours as needed for moderate pain or severe pain.     ketoprofen 75 MG capsule  Commonly known as:  ORUDIS  Take by mouth.     magnesium oxide 400 MG tablet  Commonly known as:  MAG-OX  Take by mouth.     ondansetron 4 MG tablet  Commonly known as:  ZOFRAN  Take 1 tablet (4 mg total) by mouth every 6 (six) hours as needed for nausea.     vitamin C 250 MG tablet  Commonly known as:  ASCORBIC ACID  Take 1 tablet (250 mg total) by mouth daily. Take with the iron (ferrous sulfate) to help absorption     VITAMIN D-1000 MAX ST 1000 UNITS tablet  Generic drug:  Cholecalciferol  Take by mouth.     vitamin E 400 UNIT capsule  Take by  mouth.     warfarin 5 MG tablet  Commonly known as:  COUMADIN  TAKE 1 AND 1/2 TABLETS (7.5 MG TOTAL) BY MOUTH ONCE DAILY.           Follow-up Information    Follow up with Benjaman Kindler, MD In 2 weeks.   Specialty:  Obstetrics and Gynecology   Why:  For postop check   Contact information:   [place] [address] [phone]       Signed: Benjaman Kindler 03/14/2015, 11:07 AM

## 2015-03-14 NOTE — Discharge Instructions (Signed)
Signs and Symptoms to Report Call our office at [phone] if you have any of the following.   Fever over 100.4 degrees or higher  Severe stomach pain not relieved with pain medications  Bright red bleeding thats heavier than a period that does not slow with rest  To go the bathroom a lot (frequency), you cant hold your urine (urgency), or it hurts when you empty your bladder (urinate)  Chest pain  Shortness of breath  Pain in the calves of your legs  Severe nausea and vomiting not relieved with anti-nausea medications  Signs of infection around your wounds, such as redness, hot to touch, swelling, green/yellow drainage (like pus), bad smelling discharge  Any concerns  What You Can Expect after Surgery  You may see some pink tinged, bloody fluid and bruising around the wound. This is normal.  You may notice shoulder and neck pain. This is caused by the gas used during surgery to expand your abdomen so your surgeon could get to the uterus easier.  You may have a sore throat because of the tube in your mouth during general anesthesia. This will go away in 2 to 3 days.  You may have some stomach cramps.  You may notice spotting on your panties.  You may have pain around the incision sites.   Activities after Your Discharge Follow these guidelines to help speed your recovery at home:  Do the coughing and deep breathing as you did in the hospital for 2 weeks. Use the small blue breathing device, called the incentive spirometer for 2 weeks.  Dont drive if you are in pain or taking narcotic pain medicine. You may drive when you can safely slam on the brakes, turn the wheel forcefully, and rotate your torso comfortably. This is typically 1-2 weeks. Practice in a parking lot or side street prior to attempting to drive regularly.   Ask others to help with household chores for 4 weeks.  Do not lift anything heavier that 10 pounds for 4-6 weeks. This includes pets,  children, and groceries.  Dont do strenuous activities, exercises, or sports like vacuuming, tennis, squash, etc. until your doctor says it is safe to do so. ---If you had a hysterectomy (abdominal, laparoscopic, or vaginal) do not have intercourse for 8-10 weeks.   Walk as you feel able. Rest often since it may take two or three weeks for your energy level to return to normal.   You may climb stairs  Avoid constipation:   -Eat fruits, vegetables, and whole grains. Eat small meals as your appetite will take time to return to normal.   -Drink 6 to 8 glasses of water each day unless your doctor has told you to limit your fluids.   -Use a laxative or stool softener as needed if constipation becomes a problem. You may take Miralax, metamucil, Citrucil, Colace, Senekot, FiberCon, etc. If this does not relieve the constipation, try two tablespoons of Milk Of Magnesia every 8 hours until your bowels move.   You may shower. Gently wash the wounds with a mild soap and water. Pat dry.  Do not get in a hot tub, swimming pool, etc. until your doctor agrees.  Do not use lotions, oils, powders on the wounds.  Do not douche, use tampons, or have sex until your doctor says it is okay.  Take your pain medicine when you need it. The medicine may not work as well if the pain is bad.  Take the medicines you were  taking before surgery. Other medications you will need are pain medications (Norco or Percocet) and nausea medications (Zofran).  ° °

## 2015-03-14 NOTE — Progress Notes (Signed)
Patient understands all discharge instructions and the need to make follow up appointments. Patient discharge via wheelchair with RN. 

## 2015-03-17 LAB — CYTOLOGY - NON PAP

## 2015-03-17 LAB — SURGICAL PATHOLOGY

## 2015-03-19 ENCOUNTER — Ambulatory Visit: Payer: BC Managed Care – PPO | Admitting: Anesthesiology

## 2015-03-19 ENCOUNTER — Other Ambulatory Visit
Admission: RE | Admit: 2015-03-19 | Discharge: 2015-03-19 | Disposition: A | Discharge status: Home / Self Care | Payer: BC Managed Care – PPO | Source: Ambulatory Visit | Attending: Obstetrics and Gynecology | Admitting: Obstetrics and Gynecology

## 2015-03-19 ENCOUNTER — Ambulatory Visit
Admission: RE | Admit: 2015-03-19 | Discharge: 2015-03-19 | Disposition: A | Discharge status: Home / Self Care | Payer: BC Managed Care – PPO | Source: Ambulatory Visit | Attending: Obstetrics and Gynecology | Admitting: Obstetrics and Gynecology

## 2015-03-19 ENCOUNTER — Observation Stay
Admission: RE | Admit: 2015-03-19 | Discharge: 2015-03-21 | Disposition: A | Discharge status: Home / Self Care | Payer: BC Managed Care – PPO | Source: Ambulatory Visit | Attending: Obstetrics and Gynecology | Admitting: Obstetrics and Gynecology

## 2015-03-19 ENCOUNTER — Other Ambulatory Visit: Payer: Self-pay | Admitting: Obstetrics and Gynecology

## 2015-03-19 ENCOUNTER — Encounter
Admission: RE | Disposition: A | Discharge status: Home / Self Care | Payer: Self-pay | Source: Ambulatory Visit | Attending: Obstetrics and Gynecology

## 2015-03-19 ENCOUNTER — Encounter: Payer: Self-pay | Admitting: Anesthesiology

## 2015-03-19 DIAGNOSIS — F17210 Nicotine dependence, cigarettes, uncomplicated: Secondary | ICD-10-CM | POA: Diagnosis not present

## 2015-03-19 DIAGNOSIS — E785 Hyperlipidemia, unspecified: Secondary | ICD-10-CM | POA: Diagnosis not present

## 2015-03-19 DIAGNOSIS — Z882 Allergy status to sulfonamides status: Secondary | ICD-10-CM | POA: Diagnosis not present

## 2015-03-19 DIAGNOSIS — G8918 Other acute postprocedural pain: Secondary | ICD-10-CM | POA: Insufficient documentation

## 2015-03-19 DIAGNOSIS — Z8249 Family history of ischemic heart disease and other diseases of the circulatory system: Secondary | ICD-10-CM | POA: Diagnosis not present

## 2015-03-19 DIAGNOSIS — Z823 Family history of stroke: Secondary | ICD-10-CM | POA: Insufficient documentation

## 2015-03-19 DIAGNOSIS — Z90710 Acquired absence of both cervix and uterus: Secondary | ICD-10-CM | POA: Insufficient documentation

## 2015-03-19 DIAGNOSIS — Z833 Family history of diabetes mellitus: Secondary | ICD-10-CM | POA: Diagnosis not present

## 2015-03-19 DIAGNOSIS — R1030 Lower abdominal pain, unspecified: Secondary | ICD-10-CM | POA: Diagnosis not present

## 2015-03-19 DIAGNOSIS — Z8349 Family history of other endocrine, nutritional and metabolic diseases: Secondary | ICD-10-CM | POA: Insufficient documentation

## 2015-03-19 DIAGNOSIS — Z7901 Long term (current) use of anticoagulants: Secondary | ICD-10-CM | POA: Insufficient documentation

## 2015-03-19 DIAGNOSIS — R102 Pelvic and perineal pain: Secondary | ICD-10-CM | POA: Insufficient documentation

## 2015-03-19 DIAGNOSIS — Z9889 Other specified postprocedural states: Secondary | ICD-10-CM

## 2015-03-19 DIAGNOSIS — J45909 Unspecified asthma, uncomplicated: Secondary | ICD-10-CM | POA: Insufficient documentation

## 2015-03-19 DIAGNOSIS — K219 Gastro-esophageal reflux disease without esophagitis: Secondary | ICD-10-CM | POA: Diagnosis not present

## 2015-03-19 DIAGNOSIS — G43909 Migraine, unspecified, not intractable, without status migrainosus: Secondary | ICD-10-CM | POA: Insufficient documentation

## 2015-03-19 DIAGNOSIS — N99820 Postprocedural hemorrhage and hematoma of a genitourinary system organ or structure following a genitourinary system procedure: Secondary | ICD-10-CM

## 2015-03-19 HISTORY — PX: REPAIR VAGINAL CUFF: SHX6067

## 2015-03-19 LAB — PROTIME-INR
INR: 1.5
PROTHROMBIN TIME: 18.2 s — AB (ref 11.4–15.0)

## 2015-03-19 LAB — CBC WITH DIFFERENTIAL/PLATELET
BASOS ABS: 0.1 10*3/uL (ref 0–0.1)
BASOS PCT: 1 %
EOS ABS: 0.3 10*3/uL (ref 0–0.7)
Eosinophils Relative: 3 %
HCT: 38.9 % (ref 35.0–47.0)
HEMOGLOBIN: 12.4 g/dL (ref 12.0–16.0)
LYMPHS ABS: 1.9 10*3/uL (ref 1.0–3.6)
Lymphocytes Relative: 20 %
MCH: 25.5 pg — ABNORMAL LOW (ref 26.0–34.0)
MCHC: 31.8 g/dL — ABNORMAL LOW (ref 32.0–36.0)
MCV: 80.3 fL (ref 80.0–100.0)
Monocytes Absolute: 0.7 10*3/uL (ref 0.2–0.9)
Monocytes Relative: 7 %
NEUTROS PCT: 69 %
Neutro Abs: 6.9 10*3/uL — ABNORMAL HIGH (ref 1.4–6.5)
Platelets: 230 10*3/uL (ref 150–440)
RBC: 4.84 MIL/uL (ref 3.80–5.20)
RDW: 19.7 % — ABNORMAL HIGH (ref 11.5–14.5)
WBC: 9.8 10*3/uL (ref 3.6–11.0)

## 2015-03-19 LAB — URINALYSIS COMPLETE WITH MICROSCOPIC (ARMC ONLY)
Bacteria, UA: NONE SEEN
Bilirubin Urine: NEGATIVE
GLUCOSE, UA: NEGATIVE mg/dL
Hgb urine dipstick: NEGATIVE
Ketones, ur: NEGATIVE mg/dL
LEUKOCYTES UA: NEGATIVE
NITRITE: NEGATIVE
Protein, ur: NEGATIVE mg/dL
Specific Gravity, Urine: 1.042 — ABNORMAL HIGH (ref 1.005–1.030)
Squamous Epithelial / LPF: NONE SEEN
pH: 7 (ref 5.0–8.0)

## 2015-03-19 LAB — TYPE AND SCREEN
ABO/RH(D): A POS
ANTIBODY SCREEN: NEGATIVE

## 2015-03-19 LAB — APTT: APTT: 48 s — AB (ref 24–36)

## 2015-03-19 SURGERY — REPAIR, VAGINAL CUFF
Anesthesia: Monitor Anesthesia Care

## 2015-03-19 MED ORDER — HYDROMORPHONE HCL 1 MG/ML IJ SOLN
INTRAMUSCULAR | Status: DC | PRN
  Administered 2015-03-19: 1 mg via INTRAVENOUS

## 2015-03-19 MED ORDER — ACETAMINOPHEN 10 MG/ML IV SOLN
INTRAVENOUS | Status: AC
  Filled 2015-03-19: qty 100

## 2015-03-19 MED ORDER — ALPRAZOLAM 0.5 MG PO TABS
0.2500 mg | ORAL_TABLET | Freq: Two times a day (BID) | ORAL | Status: DC | PRN
  Administered 2015-03-20 (×2): 0.25 mg via ORAL
  Filled 2015-03-19 (×2): qty 1

## 2015-03-19 MED ORDER — ONDANSETRON HCL 4 MG/2ML IJ SOLN
INTRAMUSCULAR | Status: DC | PRN
  Administered 2015-03-19: 4 mg via INTRAVENOUS

## 2015-03-19 MED ORDER — PANTOPRAZOLE SODIUM 40 MG PO TBEC
40.0000 mg | DELAYED_RELEASE_TABLET | Freq: Every day | ORAL | Status: DC
  Administered 2015-03-20 – 2015-03-21 (×2): 40 mg via ORAL
  Filled 2015-03-19 (×2): qty 1

## 2015-03-19 MED ORDER — ONDANSETRON HCL 4 MG PO TABS: 4.0000 mg | ORAL_TABLET | Freq: Four times a day (QID) | ORAL | Status: DC | PRN

## 2015-03-19 MED ORDER — LACTATED RINGERS IV SOLN
INTRAVENOUS | Status: DC
  Administered 2015-03-19 (×2): via INTRAVENOUS

## 2015-03-19 MED ORDER — FENTANYL CITRATE (PF) 100 MCG/2ML IJ SOLN
25.0000 ug | INTRAMUSCULAR | Status: AC | PRN
  Administered 2015-03-19 (×6): 25 ug via INTRAVENOUS

## 2015-03-19 MED ORDER — IOHEXOL 300 MG/ML  SOLN
100.0000 mL | Freq: Once | INTRAMUSCULAR | Status: AC | PRN
  Administered 2015-03-19: 100 mL via INTRAVENOUS

## 2015-03-19 MED ORDER — LORAZEPAM 2 MG/ML IJ SOLN: 0.5000 mg | Freq: Four times a day (QID) | INTRAMUSCULAR | Status: DC | PRN

## 2015-03-19 MED ORDER — ACETAMINOPHEN 10 MG/ML IV SOLN
INTRAVENOUS | Status: DC | PRN
  Administered 2015-03-19: 1000 mg via INTRAVENOUS

## 2015-03-19 MED ORDER — HYDROMORPHONE HCL 1 MG/ML IJ SOLN
0.2000 mg | Freq: Once | INTRAMUSCULAR | Status: AC
  Administered 2015-03-19: 0.2 mg via INTRAVENOUS

## 2015-03-19 MED ORDER — MENTHOL 3 MG MT LOZG: 1.0000 | LOZENGE | OROMUCOSAL | Status: DC | PRN

## 2015-03-19 MED ORDER — FENTANYL CITRATE (PF) 100 MCG/2ML IJ SOLN
INTRAMUSCULAR | Status: AC
  Filled 2015-03-19: qty 2

## 2015-03-19 MED ORDER — GLYCOPYRROLATE 0.2 MG/ML IJ SOLN
INTRAMUSCULAR | Status: DC | PRN
  Administered 2015-03-19: 0.2 mg via INTRAVENOUS

## 2015-03-19 MED ORDER — ONDANSETRON HCL 4 MG/2ML IJ SOLN
4.0000 mg | Freq: Once | INTRAMUSCULAR | Status: DC | PRN
Start: 2015-03-19 — End: 2015-03-19

## 2015-03-19 MED ORDER — LIDOCAINE-EPINEPHRINE 1 %-1:100000 IJ SOLN
INTRAMUSCULAR | Status: AC
  Filled 2015-03-19: qty 1

## 2015-03-19 MED ORDER — HYDROCODONE-ACETAMINOPHEN 5-325 MG PO TABS
1.0000 | ORAL_TABLET | Freq: Four times a day (QID) | ORAL | Status: DC | PRN
  Administered 2015-03-20 (×4): 2 via ORAL
  Administered 2015-03-21: 1 via ORAL
  Filled 2015-03-19 (×4): qty 2
  Filled 2015-03-19: qty 1

## 2015-03-19 MED ORDER — CYCLOBENZAPRINE HCL 5 MG PO TABS
5.0000 mg | ORAL_TABLET | Freq: Every day | ORAL | Status: DC
  Administered 2015-03-20 (×2): 5 mg via ORAL
  Filled 2015-03-19 (×4): qty 1

## 2015-03-19 MED ORDER — FENTANYL CITRATE (PF) 100 MCG/2ML IJ SOLN
INTRAMUSCULAR | Status: DC | PRN
  Administered 2015-03-19: 100 ug via INTRAVENOUS

## 2015-03-19 MED ORDER — HYDROMORPHONE HCL 1 MG/ML IJ SOLN
0.2000 mg | INTRAMUSCULAR | Status: DC | PRN
  Administered 2015-03-19 – 2015-03-20 (×3): 0.6 mg via INTRAVENOUS
  Filled 2015-03-19 (×3): qty 1

## 2015-03-19 MED ORDER — BUPROPION HCL ER (XL) 150 MG PO TB24
150.0000 mg | ORAL_TABLET | Freq: Every day | ORAL | Status: DC
  Administered 2015-03-20: 150 mg via ORAL
  Filled 2015-03-19: qty 1

## 2015-03-19 MED ORDER — LACTATED RINGERS IV SOLN
INTRAVENOUS | Status: DC
  Administered 2015-03-19 – 2015-03-21 (×5): via INTRAVENOUS

## 2015-03-19 MED ORDER — MIDAZOLAM HCL 2 MG/2ML IJ SOLN
INTRAMUSCULAR | Status: DC | PRN
  Administered 2015-03-19: 2 mg via INTRAVENOUS

## 2015-03-19 MED ORDER — PHENYLEPHRINE HCL 10 MG/ML IJ SOLN
INTRAMUSCULAR | Status: DC | PRN
  Administered 2015-03-19: 100 ug via INTRAVENOUS
  Administered 2015-03-19: 200 ug via INTRAVENOUS
  Administered 2015-03-19: 100 ug via INTRAVENOUS
  Administered 2015-03-19: 200 ug via INTRAVENOUS

## 2015-03-19 MED ORDER — CEFAZOLIN SODIUM-DEXTROSE 2-3 GM-% IV SOLR
2.0000 g | Freq: Once | INTRAVENOUS | Status: AC
  Administered 2015-03-19: 2 g via INTRAVENOUS

## 2015-03-19 MED ORDER — ESTROGENS, CONJUGATED 0.625 MG/GM VA CREA
TOPICAL_CREAM | VAGINAL | Status: DC | PRN
  Administered 2015-03-19: 1 via VAGINAL

## 2015-03-19 MED ORDER — DOCUSATE SODIUM 100 MG PO CAPS
100.0000 mg | ORAL_CAPSULE | Freq: Two times a day (BID) | ORAL | Status: DC
  Administered 2015-03-20 – 2015-03-21 (×3): 100 mg via ORAL
  Filled 2015-03-19 (×3): qty 1

## 2015-03-19 MED ORDER — ESTROGENS, CONJUGATED 0.625 MG/GM VA CREA
TOPICAL_CREAM | VAGINAL | Status: AC
  Filled 2015-03-19: qty 30

## 2015-03-19 MED ORDER — ONDANSETRON HCL 4 MG/2ML IJ SOLN: 4.0000 mg | Freq: Four times a day (QID) | INTRAMUSCULAR | Status: DC | PRN

## 2015-03-19 MED ORDER — HYDROMORPHONE HCL 1 MG/ML IJ SOLN
INTRAMUSCULAR | Status: AC
  Administered 2015-03-19: 0.2 mg via INTRAVENOUS
  Filled 2015-03-19: qty 1

## 2015-03-19 MED ORDER — CEFAZOLIN SODIUM-DEXTROSE 2-3 GM-% IV SOLR
INTRAVENOUS | Status: AC
  Administered 2015-03-19: 2 g via INTRAVENOUS
  Filled 2015-03-19: qty 50

## 2015-03-19 MED ORDER — DEXMEDETOMIDINE HCL IN NACL 200 MCG/50ML IV SOLN
INTRAVENOUS | Status: DC | PRN
  Administered 2015-03-19 (×2): 20 ug via INTRAVENOUS

## 2015-03-19 MED ORDER — FLUTICASONE PROPIONATE 50 MCG/ACT NA SUSP
1.0000 | Freq: Every day | NASAL | Status: DC
  Administered 2015-03-20 – 2015-03-21 (×2): 1 via NASAL
  Filled 2015-03-19: qty 16

## 2015-03-19 MED ORDER — PROPOFOL 10 MG/ML IV BOLUS
INTRAVENOUS | Status: DC | PRN
  Administered 2015-03-19 (×2): 20 mg via INTRAVENOUS
  Administered 2015-03-19: 100 mg via INTRAVENOUS
  Administered 2015-03-19: 20 mg via INTRAVENOUS

## 2015-03-19 SURGICAL SUPPLY — 36 items
BAG URO DRAIN 2000ML W/SPOUT (MISCELLANEOUS) IMPLANT
CANISTER SUCT 1200ML W/VALVE (MISCELLANEOUS) ×3 IMPLANT
CATH FOLEY 2WAY  5CC 16FR (CATHETERS) ×2
CATH URTH 16FR FL 2W BLN LF (CATHETERS) ×1 IMPLANT
CNTNR SPEC 2.5X3XGRAD LEK (MISCELLANEOUS) ×1
CONT SPEC 4OZ STER OR WHT (MISCELLANEOUS) ×2
CONTAINER SPEC 2.5X3XGRAD LEK (MISCELLANEOUS) ×1 IMPLANT
DRAPE PERI LITHO V/GYN (MISCELLANEOUS) ×3 IMPLANT
DRAPE SURG 17X11 SM STRL (DRAPES) IMPLANT
DRAPE UNDER BUTTOCK W/FLU (DRAPES) ×3 IMPLANT
GAUZE PACK 2X3YD (MISCELLANEOUS) ×3 IMPLANT
GLOVE BIO SURGEON STRL SZ 6.5 (GLOVE) ×4 IMPLANT
GLOVE BIO SURGEONS STRL SZ 6.5 (GLOVE) ×2
GLOVE INDICATOR 7.0 STRL GRN (GLOVE) ×6 IMPLANT
GOWN STRL REUS W/ TWL LRG LVL3 (GOWN DISPOSABLE) ×1 IMPLANT
GOWN STRL REUS W/ TWL XL LVL3 (GOWN DISPOSABLE) ×1 IMPLANT
GOWN STRL REUS W/TWL LRG LVL3 (GOWN DISPOSABLE) ×2
GOWN STRL REUS W/TWL XL LVL3 (GOWN DISPOSABLE) ×2
KIT RM TURNOVER CYSTO AR (KITS) ×3 IMPLANT
LABEL OR SOLS (LABEL) IMPLANT
NDL SAFETY 22GX1.5 (NEEDLE) IMPLANT
PACK BASIN MINOR ARMC (MISCELLANEOUS) ×3 IMPLANT
PAD GROUND ADULT SPLIT (MISCELLANEOUS) ×3 IMPLANT
PAD OB MATERNITY 4.3X12.25 (PERSONAL CARE ITEMS) ×3 IMPLANT
PAD PREP 24X41 OB/GYN DISP (PERSONAL CARE ITEMS) ×3 IMPLANT
SLEEVE SCD COMPRESS THIGH MED (MISCELLANEOUS) ×3 IMPLANT
SURGILUBE 2OZ TUBE FLIPTOP (MISCELLANEOUS) ×3 IMPLANT
SUT PDS 2-0 27IN (SUTURE) IMPLANT
SUT VIC AB 0 CT1 27 (SUTURE) ×2
SUT VIC AB 0 CT1 27XCR 8 STRN (SUTURE) ×1 IMPLANT
SUT VIC AB 0 CT1 36 (SUTURE) IMPLANT
SUT VIC AB 2-0 SH 27 (SUTURE)
SUT VIC AB 2-0 SH 27XBRD (SUTURE) IMPLANT
SYR CONTROL 10ML (SYRINGE) IMPLANT
SYRINGE 10CC LL (SYRINGE) IMPLANT
WATER STERILE IRR 1000ML POUR (IV SOLUTION) IMPLANT

## 2015-03-19 NOTE — OR Nursing (Signed)
Patient with temp. Of 100.5 tympanic. Dr. Trixie Rude notified. Ancef 2 gms ordered. She states patient does not need SCD's because she is anticoagulated.

## 2015-03-19 NOTE — H&P (Signed)
Chief Complaint:    Mikayla Reed is a 49 y.o. female here for Post Operative Visit .  6 days out from uncomplicated TLH with vaginal closure, on full anticoagulation, currently bridging from 80mg  Lovenox BID to maintenance warfarin, presenting with new onset vaginal bleeding. Post surgical pelvic pain continues.  Pt sent from CT scan of pelvis, which found postoperative changes but no signs of pelvic hematoma, only blood in the vagina.  Pt describes midline pain just below umblicus, but no dysuria, bowel trouble. Is passing gas, bowel movement x2 since surgery.  Her INR was 1.5, PTT 48. CBC stable with Hgb 12.  Past Medical History:  has a past medical history of Asthma without status asthmaticus; Fibrocystic breast disease; GERD (gastroesophageal reflux disease); hypercoagulable state; Hyperlipidemia; and Migraines.  Past Surgical History:  has no past surgical history on file. Family History: family history includes Diabetes mellitus in her maternal grandfather; Heart disease in her maternal grandfather; Hyperlipidemia in her father; Hypertension in her father, maternal grandfather, and maternal grandmother; Migraines in her maternal grandmother and mother; Peripheral vascular disease in her paternal grandmother; Stroke in her maternal grandfather, maternal grandmother, and paternal grandmother. Social History:  reports that she has been smoking Cigarettes.  She started smoking about 31 years ago. She has been smoking about 0.50 packs per day. She has never used smokeless tobacco. She reports that she drinks alcohol. OB/GYN History:  OB History    Gravida Para Term Preterm AB TAB SAB Ectopic Multiple Living   3 2 2  1  1   2       Allergies: is allergic to sulfa (sulfonamide antibiotics). Medications:  Current Outpatient Prescriptions:  .  ALPRAZolam (XANAX) 0.25 MG tablet, Take 1 tablet (0.25 mg total) by mouth once daily as needed for Sleep., Disp: 30 tablet, Rfl: 1 .  buPROPion  (WELLBUTRIN XL) 150 MG XL tablet, 1 PO daily, Disp: 30 tablet, Rfl: 11 .  cetirizine (ZYRTEC) 10 MG tablet, Take 10 mg by mouth once daily., Disp: , Rfl:  .  cholecalciferol (CHOLECALCIFEROL) 1,000 unit tablet, Take by mouth., Disp: , Rfl:  .  cyanocobalamin (B-12 DOTS) 500 MCG tablet, Take 500 mcg by mouth once daily., Disp: , Rfl:  .  cyclobenzaprine (FLEXERIL) 5 MG tablet, Take 1 tablet (5 mg total) by mouth once daily as needed for Muscle spasms., Disp: 20 tablet, Rfl: 1 .  esomeprazole (NEXIUM) 40 MG DR capsule, Take 1 capsule (40 mg total) by mouth once daily., Disp: 90 capsule, Rfl: 3 .  fluticasone (FLONASE) 50 mcg/actuation nasal spray, PLACE 2 SPRAYS INTO BOTH NOSTRILS ONCE DAILY., Disp: 16 g, Rfl: 5 .  HYDROcodone-acetaminophen (NORCO) 7.5-325 mg tablet, Take 1 tablet by mouth every 6 (six) hours as needed for Pain., Disp: 60 tablet, Rfl: 0 .  ketoprofen (ORUDIS) 75 MG capsule, Take 1 capsule (75 mg total) by mouth 3 (three) times daily as needed for Pain., Disp: 60 capsule, Rfl: 1 .  magnesium oxide (MAG-OX) 400 mg tablet, Take 400 mg by mouth once daily., Disp: , Rfl:  .  phenazopyridine (PYRIDIUM) 200 MG tablet, Take 1 tablet (200 mg total) by mouth 3 (three) times daily with meals., Disp: 6 tablet, Rfl: 2 .  potassium chloride (K-DUR,KLOR-CON) 20 MEQ ER tablet, Take 1 tablet (20 mEq total) by mouth 2 (two) times daily., Disp: 4 tablet, Rfl: 0 .  predniSONE (DELTASONE) 10 MG tablet, 2 PO daily x 5 days then 1 PO daily x 5 days, Disp: 10  tablet, Rfl: 0 .  ranitidine (ZANTAC) 150 MG tablet, Take 1 tablet (150 mg total) by mouth 2 (two) times daily., Disp: 60 tablet, Rfl: 0 .  vitamin E 400 UNIT capsule, Take 400 Units by mouth once daily., Disp: , Rfl:  .  warfarin (COUMADIN) 5 MG tablet, TAKE 1 AND 1/2 TABLETS (7.5 MG TOTAL) BY MOUTH ONCE DAILY., Disp: 135 tablet, Rfl: 1   Review of Systems: General:   No fatigue or weight loss Eyes:   No vision changes Ears:   No hearing  difficulty Respiratory:                No cough or shortness of breath Pulmonary:   No asthma or shortness of breath Cardiovascular:        No chest pain, palpitations, dyspnea on exertion Gastrointestinal:          No abdominal bloating, chronic diarrhea, constipations, masses, pain or hematochezia Genitourinary:  No hematuria, dysuria, abnormal vaginal discharge, + see HPI Lymphatic:  No swollen lymph nodes Musculoskeletal: No muscle weakness Neurologic:  No extremity weakness, syncope, seizure disorder Psychiatric:  No history of depression, delusions or suicidal/homicidal ideation    Exam:   Vitals:   03/19/15 1702  BP: 129/83  Pulse: 83    WDWN white female in NAD Lungs: CTA  CV : RRR without murmur   Breast: deferred Neck:  no thyromegaly Abdomen: soft , no mass, normal active bowel sounds,  non-tender, no rebound tenderness Skin: No rashes, ulcers or skin lesions noted. No excessive hirsutism or acne noted.  Neurological: Appears alert and oriented and is a good historian. No gross abnormalities are noted. Psychological: Normal affect and mood. No signs of anxiety or depression noted.   Pelvic:   External genitalia: vulva /labia no lesions, Tanner stage 5  Urethra: no prolapse  Vagina: normal physiologic d/c  Cervix: cuff intact with vaginal oozing from midline, no evidence of dehiscence. This area was cauterized with silver nitrite, but bleeding site unable to be fully visualized 2/2 patient discomfort.  Uterus: normal size shape and contour, non-tender  Adnexa: no mass,  non-tender    Rectovaginal: external exam normal    Impression:   The primary encounter diagnosis was Post-operative pain. A diagnosis of Postoperative vaginal bleeding following genitourinary procedure was also pertinent to this visit.    Plan:   - Vaginal cuff bleeding after TLH on full anticoagulation: Add on case to OR to suture vaginal cuff. Will stop Lovenox and ask patient to delay  restart of Coumadin for 1-2 weeks, as her prior blood clot was in the setting of estrogen-containing contraception and smoking, which are a high risk combination. She is aware, that as always, she does have a chance to have a  Blood clot again. Her bleeding is significant, though, that for this immediate postoperative period, her risk of bleeding is higher than her risk of clot.  Plan for several figure of 8s in the vaginal cuff under sedation if possible.  We discussed vaginal packing with foley cathedar overnight with reassessment in the morning, or expectant management. It is likely that she will stop bleeding when we stop the lovenox, but this may take a couple of days and she is bleeding more than a pad per hour, approx 100mg /30 min given the blood on the Chux in the office today. Surgical repair is therefore reasonable and recommended.  She is hemodynamically stable. Her BP and HR are normal. Her Hgb is stable.  Benefits and  risks to surgery: The proposed benefit of the surgery has been discussed with the patient. The possible risks include, but are not limited to: organ injury to the bowel , bladder, ureters, and major blood vessels and nerves. There is a possibility of additional surgeries resulting from these injuries. There is also the risk of blood transfusion and the need to receive blood products during or after the procedure which may rarely lead to HIV or Hepatitis C infection. There is a risk of developing a deep venous thrombosis or a pulmonary embolism . There is the possibility of wound infection and also anesthetic complications, even the rare possibility of death. The patient understands these risks and wishes to proceed. All questions have been answered and the consent has been signed.   Orders Placed This Encounter  Procedures  . CT abdomen pelvis with contrast    [facility] - anticoagulated patient    Standing Status:   Future    Standing Expiration Date:   03/19/2016    Order  Specific Question:   Reason for Exam:    Answer:   post op bleeding and pain    Order Specific Question:   Is the patient pregnant?    Answer:   No    Order Specific Question:   What is the patient's sedation requirement?    Answer:   No Sedation    Order Specific Question:   Preferred Location:    Answer:   Other - comment below  . CBC w/auto Differential (5 Part)    At [facility]    Standing Status:   Future    Standing Expiration Date:   06/17/2015  . PT and PTT - [facility]    Standing Status:   Future    Standing Expiration Date:   03/18/2016  . Prothrombin Time (INR)    Standing Status:   Future    Standing Expiration Date:   03/18/2016

## 2015-03-19 NOTE — Transfer of Care (Signed)
Immediate Anesthesia Transfer of Care Note  Patient: Mikayla Reed  Procedure(s) Performed: Procedure(s): REPAIR VAGINAL CUFF (N/A)  Patient Location: PACU  Anesthesia Type:General  Level of Consciousness: sedated  Airway & Oxygen Therapy: Patient Spontanous Breathing and Patient connected to nasal cannula oxygen  Post-op Assessment: Report given to RN and Post -op Vital signs reviewed and stable  Post vital signs: Reviewed and stable  Last Vitals:  Filed Vitals:   03/19/15 1928 03/19/15 2200  BP:  95/61  Pulse:  82  Temp: 38.1 C 37.6 C  Resp:  15    Complications: No apparent anesthesia complications

## 2015-03-19 NOTE — Discharge Instructions (Addendum)
Discharge instructions after  robotically-assisted total laparoscopic hysterectomy  Signs and Symptoms to Report Call our office at [phone] if you have any of the following.   Fever over 100.4 degrees or higher  Severe stomach pain not relieved with pain medications  Bright red bleeding thats heavier than a period that does not slow with rest  To go the bathroom a lot (frequency), you cant hold your urine (urgency), or it hurts when you empty your bladder (urinate)  Chest pain  Shortness of breath  Pain in the calves of your legs  Severe nausea and vomiting not relieved with anti-nausea medications  Signs of infection around your wounds, such as redness, hot to touch, swelling, green/yellow drainage (like pus), bad smelling discharge  Any concerns  What You Can Expect after Surgery  You may have a sore throat because of the tube in your mouth during general anesthesia. This will go away in 2 to 3 days.  You may have some stomach cramps.  You may notice spotting on your panties.  You may have pain around the incision sites.   Activities after Your Discharge Follow these guidelines to help speed your recovery at home:  Do the coughing and deep breathing as you did in the hospital for 2 weeks. Use the small blue breathing device, called the incentive spirometer for 2 weeks.  Dont drive if you are in pain or taking narcotic pain medicine. You may drive when you can safely slam on the brakes, turn the wheel forcefully, and rotate your torso comfortably. This is typically 1-2 weeks. Practice in a parking lot or side street prior to attempting to drive regularly.   Ask others to help with household chores for 4 weeks.  Do not lift anything heavier that 10 pounds for 4-6 weeks. This includes pets, children, and groceries.  Dont do strenuous activities, exercises, or sports like vacuuming, tennis, squash, etc. until your doctor says it is safe to do so. ---Maintain  pelvic rest for 12 weeks. This means nothing in the vagina at all (no douching, tampons, intercourse) for 12 weeks.   Walk as you feel able. Rest often since it may take two or three weeks for your energy level to return to normal.   You may climb stairs  Avoid constipation:   -Eat fruits, vegetables, and whole grains. Eat small meals as your appetite will take time to return to normal.   -Drink 6 to 8 glasses of water each day unless your doctor has told you to limit your fluids.   -Use a laxative or stool softener as needed if constipation becomes a problem. You may take Miralax, metamucil, Citrucil, Colace, Senekot, FiberCon, etc. If this does not relieve the constipation, try two tablespoons of Milk Of Magnesia every 8 hours until your bowels move.   You may shower. Gently wash the wounds with a mild soap and water. Pat dry.  Do not get in a hot tub, swimming pool, etc. for 6 weeks.  Do not use lotions, oils, powders on the wounds.  Do not douche, use tampons, or have sex until your doctor says it is okay.  Take your pain medicine when you need it. The medicine may not work as well if the pain is bad.  Take the medicines you were taking before surgery. Other medications you will need are pain medications (Norco or Percocet) and nausea medications (Zofran).   Call your doctor for increased pain or vaginal bleeding, temperature above 100.4, depression, or  concerns.  No strenuous activity or heavy lifting for 6 weeks.  No intercourse, tampons, douching, or enemas for 6 weeks.  No tub baths-showers only.  No driving for 2 weeks or while taking pain medications.  Keep incision clean and dry.  Call your doctor for incision concerns including redness, swelling, bleeding or drainage, or if begins to come apart.

## 2015-03-19 NOTE — Anesthesia Procedure Notes (Signed)
Procedure Name: LMA Insertion Date/Time: 03/19/2015 8:18 PM Performed by: Rosaria Ferries, Urijah Raynor Pre-anesthesia Checklist: Patient identified, Emergency Drugs available, Suction available and Patient being monitored Patient Re-evaluated:Patient Re-evaluated prior to inductionOxygen Delivery Method: Circle system utilized Preoxygenation: Pre-oxygenation with 100% oxygen LMA: LMA inserted LMA Size: 3.5 Number of attempts: 1 Placement Confirmation: breath sounds checked- equal and bilateral and positive ETCO2 Dental Injury: Teeth and Oropharynx as per pre-operative assessment

## 2015-03-19 NOTE — Anesthesia Preprocedure Evaluation (Addendum)
Anesthesia Evaluation  Patient identified by MRN, date of birth, ID band Patient awake    Reviewed: Allergy & Precautions, NPO status , Patient's Chart, lab work & pertinent test results, reviewed documented beta blocker date and time   Airway Mallampati: II  TM Distance: >3 FB     Dental  (+) Chipped   Pulmonary former smoker,           Cardiovascular      Neuro/Psych  Headaches, Anxiety    GI/Hepatic GERD  ,  Endo/Other    Renal/GU      Musculoskeletal   Abdominal   Peds  Hematology   Anesthesia Other Findings   Reproductive/Obstetrics                            Anesthesia Physical Anesthesia Plan  ASA: II  Anesthesia Plan: MAC   Post-op Pain Management:    Induction:   Airway Management Planned:   Additional Equipment:   Intra-op Plan:   Post-operative Plan:   Informed Consent: I have reviewed the patients History and Physical, chart, labs and discussed the procedure including the risks, benefits and alternatives for the proposed anesthesia with the patient or authorized representative who has indicated his/her understanding and acceptance.     Plan Discussed with: CRNA  Anesthesia Plan Comments:        Anesthesia Quick Evaluation

## 2015-03-19 NOTE — Op Note (Signed)
Jonathon Bellows PROCEDURE DATE: 03/19/2015  PREOPERATIVE DIAGNOSIS:   Vaginal bleeding, postoperatively Anticoagulation  POSTOPERATIVE DIAGNOSIS:   Same SURGEON:   Benjaman Kindler, M.D. ASSISTANT: Surgical tech OPERATION:  Exam under anesthesia, Repair of vaginal cuff ANESTHESIA:  LMA  INDICATIONS: The patient is a 49 y.o. with a history of uncomplicated TLH (EBL 50) with vaginal cuff closure 6 days ago, on full anticoagulation postoperatively. She is currently bridged with 80mg  BID Lovenox and is taking 7.5mg  daily coumadin with an INR goal of 2-2.5. Her INR today is 1.5, with a stable Hgb 2g higher than leaving the hospital 5 days ago, presumably because of a diluational factor. She presented with increasing vaginal bleeding that started postop day #5 and became heavy of about 150ml/hr estimated from # of pads needed during her visit in the clinic. Silver nitrate was used in the clinic without success. The decision was made to proceed to the OR for exam under anesthesia with repair of any bleeding vessels.   The patient made a decision to undergo definite surgical treatment. On the preoperative visit, the risks, benefits, indications, and alternatives of the procedure were reviewed with the patient.  Prior to surgery, the risks of surgery were again discussed with the patient including but not limited to: bleeding which may require transfusion or reoperation; infection which may require antibiotics; injury to bowel, bladder, ureters or other surrounding organs; need for additional procedures; thromboembolic phenomenon, incisional problems and other postoperative/anesthesia complications. Written informed consent was obtained.    OPERATIVE FINDINGS: Significant arterial bleeding at 6 and 9 o'clock of the vaginal cuff, not controlled with several figure of eight stitches. Closed peritoneum with no bleeding deep to the cuff.  ESTIMATED BLOOD LOSS: 250 ml FLUIDS:  1200 ml of Lactated  Ringers URINE OUTPUT:  250 ml of clear yellow urine. SPECIMENS:  Urine culture and UA sent PACKING: Vaginal packing against the cuff COMPLICATIONS:  None immediate.  DESCRIPTION OF PROCEDURE:  The patient received prophylactic intravenous antibiotics and had sequential compression devices applied to her lower extremities while in the preoperative area.    She was taken to the operating room, where she was identified by name and birth date. Anesthesia was administered and was found to be adequate.  She was placed in the dorsal lithotomy position, and was prepped and draped in a sterile manner.  A formal time out procedure was performed with all team members present and in agreement. A Foley catheter was inserted into her bladder and attached to gravity drainage. Attention was turned to her pelvis. Of note, all sutures used in this case were 0 Vicryl.   A weighted speculum was placed in the vagina, and the above findings noted. Her original vaginal cuff closure was intact, but deep welling bright red bleeding at the 9 o'clock position required further exploration.  Her original cuff closure sutures were removed to assure that I could safely throw deep enough sutures to close the vessels without entering the intraperitoneal space blindly. Behind the vaginal cuff was a wall of peritoneum that was firm, edematous and not bleeding. I was unable to see her bowels or into her peritoneal cavity because her peritoneum was closed, and I was able to visualize her full vaginal cuff. Her cuff was reclosed with 6 or 8 figure of 8 sutures in an interrupted manner, taking care to avoid lateral stitches towards the ureters.  Her bleeding at the end of the case was oozing only. I feel reassured at any bleeding that  continued will exit vaginally. The vagina was firmly packed with a single packing strip, which I will leave in place x24hrs. Her foley catheter will remain in place during that time as well.  IV Tylenol and  Dilaudid were given prior to leaving the OR. No NSAIDS or Toradol will be given. I will also hold her Lovenox and Coumadin until she is hemostatic, and for 1-2 weeks postop.  The patient tolerated the procedure well.  All instruments, needles, and sponge counts were correct x 2. The patient was taken to the recovery room in stable condition.    Angelina Pih, MD, MPH

## 2015-03-20 ENCOUNTER — Encounter: Payer: Self-pay | Admitting: Obstetrics and Gynecology

## 2015-03-20 DIAGNOSIS — N99820 Postprocedural hemorrhage and hematoma of a genitourinary system organ or structure following a genitourinary system procedure: Secondary | ICD-10-CM | POA: Diagnosis not present

## 2015-03-20 LAB — COMPREHENSIVE METABOLIC PANEL
ALBUMIN: 2.8 g/dL — AB (ref 3.5–5.0)
ALT: 26 U/L (ref 14–54)
AST: 24 U/L (ref 15–41)
Alkaline Phosphatase: 64 U/L (ref 38–126)
Anion gap: 4 — ABNORMAL LOW (ref 5–15)
BUN: 10 mg/dL (ref 6–20)
CHLORIDE: 104 mmol/L (ref 101–111)
CO2: 28 mmol/L (ref 22–32)
CREATININE: 0.71 mg/dL (ref 0.44–1.00)
Calcium: 8 mg/dL — ABNORMAL LOW (ref 8.9–10.3)
GFR calc Af Amer: >60 mL/min (ref >60)
GFR calc non Af Amer: >60 mL/min (ref >60)
GLUCOSE: 110 mg/dL — AB (ref 65–99)
POTASSIUM: 4.2 mmol/L (ref 3.5–5.1)
Sodium: 136 mmol/L (ref 135–145)
Total Bilirubin: 0.5 mg/dL (ref 0.3–1.2)
Total Protein: 5.5 g/dL — ABNORMAL LOW (ref 6.5–8.1)

## 2015-03-20 LAB — URINALYSIS COMPLETE WITH MICROSCOPIC (ARMC ONLY)
Bilirubin Urine: NEGATIVE
Glucose, UA: NEGATIVE mg/dL
KETONES UR: NEGATIVE mg/dL
Leukocytes, UA: NEGATIVE
Nitrite: NEGATIVE
PH: 8 (ref 5.0–8.0)
PROTEIN: NEGATIVE mg/dL
SQUAMOUS EPITHELIAL / LPF: NONE SEEN
Specific Gravity, Urine: 1.009 (ref 1.005–1.030)
WBC UA: NONE SEEN WBC/hpf (ref 0–5)

## 2015-03-20 LAB — CBC
HEMATOCRIT: 31.8 % — AB (ref 35.0–47.0)
Hemoglobin: 10.3 g/dL — ABNORMAL LOW (ref 12.0–16.0)
MCH: 25.9 pg — ABNORMAL LOW (ref 26.0–34.0)
MCHC: 32.5 g/dL (ref 32.0–36.0)
MCV: 79.7 fL — AB (ref 80.0–100.0)
PLATELETS: 206 10*3/uL (ref 150–440)
RBC: 3.99 MIL/uL (ref 3.80–5.20)
RDW: 19.3 % — AB (ref 11.5–14.5)
WBC: 11.2 10*3/uL — AB (ref 3.6–11.0)

## 2015-03-20 MED ORDER — CIPROFLOXACIN HCL 500 MG PO TABS
500.0000 mg | ORAL_TABLET | Freq: Two times a day (BID) | ORAL | Status: DC
  Administered 2015-03-20 – 2015-03-21 (×2): 500 mg via ORAL
  Filled 2015-03-20 (×4): qty 1

## 2015-03-20 MED ORDER — DIPHENHYDRAMINE HCL 25 MG PO CAPS
25.0000 mg | ORAL_CAPSULE | Freq: Four times a day (QID) | ORAL | Status: DC | PRN
  Administered 2015-03-20: 25 mg via ORAL
  Filled 2015-03-20: qty 1

## 2015-03-20 NOTE — Progress Notes (Signed)
Pt was under the impression someone from [facility] would come by to see her tonight to remove foley and vaginal packing. Called to confirm with Dr. Archie Balboa, who asked me to remove and call him with any concerns. While on the phone, CNA reports temp of 101.1 and rechecked in 15 minutes (after removing cover) temp was 100.1. MD ordered uranalysis to be collected while foley still in place. Foley Catheter and vaginal packing removed with no concerns at this time.

## 2015-03-20 NOTE — Progress Notes (Signed)
1 Day Post-Op Procedure(s) (LRB): REPAIR VAGINAL CUFF (N/A)  Subjective: Patient reports no nausea/vomiting, significant pelvic pain and anxiety  Objective: I have reviewed patient's vital signs, intake and output, labs and microbiology.  General: alert, cooperative and moderate distress Resp: clear to auscultation bilaterally Cardio: regular rate and rhythm, S1, S2 normal, no murmur, click, rub or gallop GI: soft, non-tender; bowel sounds normal; no masses,  no organomegaly and appropriately tender Extremities: extremities normal, atraumatic, no cyanosis or edema, Homans sign is negative, no sign of DVT and no edema, redness or tenderness in the calves or thighs Vaginal Bleeding: none - vaginal packing in place, Foley catheter in place  Assessment: s/p Procedure(s): REPAIR VAGINAL CUFF (N/A): stable, tolerating diet and ambulating  Plan: Encourage ambulation Continue foley due to vaginal packing Pain medication, po and iv, PRN. Antianxitey as needed.  Plan to remove vaginal packing 24hrs after surgery or around dinnertime tonight. Pt may be discharged if pain under control and vaginal bleeding minimal at that time. Plan to restart Warfarin in 1-2 weeks, or per PCP.   Benjaman Kindler 03/20/2015, 10:09 AM

## 2015-03-20 NOTE — Anesthesia Postprocedure Evaluation (Signed)
Anesthesia Post Note  Patient: Mikayla Reed  Procedure(s) Performed: Procedure(s) (LRB): REPAIR VAGINAL CUFF (N/A)  Patient location during evaluation: PACU Anesthesia Type: General Level of consciousness: awake Pain management: pain level controlled Vital Signs Assessment: post-procedure vital signs reviewed and stable Respiratory status: spontaneous breathing Cardiovascular status: blood pressure returned to baseline    Last Vitals:  Filed Vitals:   03/20/15 0257 03/20/15 0831  BP: 122/58 104/60  Pulse: 78 89  Temp: 36.7 C 37.3 C  Resp: 16 20    Last Pain:  Filed Vitals:   03/20/15 0914  PainSc: 0-No pain                 [name] S

## 2015-03-21 DIAGNOSIS — N99820 Postprocedural hemorrhage and hematoma of a genitourinary system organ or structure following a genitourinary system procedure: Secondary | ICD-10-CM | POA: Diagnosis not present

## 2015-03-21 LAB — BASIC METABOLIC PANEL
ANION GAP: 3 — AB (ref 5–15)
BUN: 9 mg/dL (ref 6–20)
CHLORIDE: 107 mmol/L (ref 101–111)
CO2: 29 mmol/L (ref 22–32)
Calcium: 8.4 mg/dL — ABNORMAL LOW (ref 8.9–10.3)
Creatinine, Ser: 0.77 mg/dL (ref 0.44–1.00)
GFR calc Af Amer: >60 mL/min (ref >60)
Glucose, Bld: 107 mg/dL — ABNORMAL HIGH (ref 65–99)
POTASSIUM: 3.9 mmol/L (ref 3.5–5.1)
SODIUM: 139 mmol/L (ref 135–145)

## 2015-03-21 LAB — CBC WITH DIFFERENTIAL/PLATELET
BASOS ABS: 0 10*3/uL (ref 0–0.1)
Basophils Relative: 0 %
EOS ABS: 0.5 10*3/uL (ref 0–0.7)
Eosinophils Relative: 5 %
HCT: 29.9 % — ABNORMAL LOW (ref 35.0–47.0)
HEMOGLOBIN: 9.6 g/dL — AB (ref 12.0–16.0)
LYMPHS ABS: 1.2 10*3/uL (ref 1.0–3.6)
LYMPHS PCT: 11 %
MCH: 25.7 pg — AB (ref 26.0–34.0)
MCHC: 32.1 g/dL (ref 32.0–36.0)
MCV: 80.1 fL (ref 80.0–100.0)
Monocytes Absolute: 0.7 10*3/uL (ref 0.2–0.9)
Monocytes Relative: 6 %
NEUTROS PCT: 78 %
Neutro Abs: 8.4 10*3/uL — ABNORMAL HIGH (ref 1.4–6.5)
PLATELETS: 190 10*3/uL (ref 150–440)
RBC: 3.73 MIL/uL — AB (ref 3.80–5.20)
RDW: 19.2 % — ABNORMAL HIGH (ref 11.5–14.5)
WBC: 10.8 10*3/uL (ref 3.6–11.0)

## 2015-03-21 LAB — URINE CULTURE: CULTURE: NO GROWTH

## 2015-03-21 MED ORDER — CIPROFLOXACIN HCL 500 MG PO TABS: 500.0000 mg | ORAL_TABLET | Freq: Two times a day (BID) | ORAL | Status: AC

## 2015-03-21 NOTE — Discharge Summary (Signed)
  The patient was admitted on the day of scheduled surgery and proceeded to the operating room as scheduled for the below stated procedure without complications. (For complete operative information, please see operative report).  Postoperatively she was admitted to the floor. Pain was initially managed with  PO meds when the patient was tolerating a regular diet on postoperative day number 1.  Postoperative labs were stable.  Foley cath was discontinued on postoperative day number 1 and patient passed a voiding trial without difficulty.   Patient was felt to be stable for discharge on postoperative day number 2 when she was tolerating a regular diet, pain was controlled with po pain medications, and she was ambulating and voiding without difficulty. Vital signs were stable and physical exam remained benign throughout her hospital stay. Incision was non-visible in the vaginal cuff.  She will follow up per below for post-op check.  Pt has Norco at home, and prescribed Zofran, and Colace    She was given specific instructions and numbers to call in written and verbal format. She verbalized understanding, agrees with the plan of care, and all questions answered to her satisfaction.

## 2015-03-21 NOTE — Progress Notes (Signed)
Discharge instructions provided.  Pt and sig other verbalize understanding of all instructions.  CNM states that prescriptions were called to pt's pharmacy.  Pt discharged to home at 1148 on 03/21/15 via wheelchair by volunteer. Lavonia Dana [facility] 03/21/2015 12:30 PM

## 2015-03-22 LAB — URINE CULTURE
Culture: NO GROWTH
Special Requests: NORMAL

## 2015-03-26 ENCOUNTER — Ambulatory Visit: Payer: Self-pay

## 2015-03-31 ENCOUNTER — Ambulatory Visit
Admission: RE | Admit: 2015-03-31 | Discharge: 2015-03-31 | Disposition: A | Discharge status: Home / Self Care | Payer: BC Managed Care – PPO | Source: Ambulatory Visit | Attending: Obstetrics and Gynecology | Admitting: Obstetrics and Gynecology

## 2015-03-31 ENCOUNTER — Other Ambulatory Visit: Payer: Self-pay | Admitting: Obstetrics and Gynecology

## 2015-03-31 ENCOUNTER — Ambulatory Visit: Payer: BC Managed Care – PPO

## 2015-03-31 DIAGNOSIS — Z1231 Encounter for screening mammogram for malignant neoplasm of breast: Secondary | ICD-10-CM | POA: Diagnosis present

## 2015-03-31 DIAGNOSIS — M79661 Pain in right lower leg: Secondary | ICD-10-CM

## 2015-04-03 ENCOUNTER — Ambulatory Visit: Payer: BC Managed Care – PPO

## 2015-07-30 ENCOUNTER — Encounter: Admission: RE | Payer: Self-pay | Source: Ambulatory Visit

## 2015-07-30 ENCOUNTER — Ambulatory Visit
Admission: RE | Admit: 2015-07-30 | Payer: BC Managed Care – PPO | Source: Ambulatory Visit | Admitting: Gastroenterology

## 2015-07-30 SURGERY — COLONOSCOPY WITH PROPOFOL
Anesthesia: General

## 2016-02-23 ENCOUNTER — Other Ambulatory Visit: Payer: Self-pay | Admitting: Obstetrics and Gynecology

## 2016-02-23 DIAGNOSIS — Z1231 Encounter for screening mammogram for malignant neoplasm of breast: Secondary | ICD-10-CM

## 2016-04-04 ENCOUNTER — Ambulatory Visit: Payer: BC Managed Care – PPO

## 2016-05-05 ENCOUNTER — Ambulatory Visit
Admission: RE | Admit: 2016-05-05 | Discharge: 2016-05-05 | Disposition: A | Discharge status: Home / Self Care | Payer: BC Managed Care – PPO | Source: Ambulatory Visit | Attending: Obstetrics and Gynecology | Admitting: Obstetrics and Gynecology

## 2016-05-05 DIAGNOSIS — Z1231 Encounter for screening mammogram for malignant neoplasm of breast: Secondary | ICD-10-CM | POA: Insufficient documentation

## 2017-02-23 ENCOUNTER — Other Ambulatory Visit: Payer: Self-pay | Admitting: Obstetrics and Gynecology

## 2017-02-23 DIAGNOSIS — Z1231 Encounter for screening mammogram for malignant neoplasm of breast: Secondary | ICD-10-CM

## 2017-05-24 ENCOUNTER — Ambulatory Visit
Admission: RE | Admit: 2017-05-24 | Discharge: 2017-05-24 | Disposition: A | Discharge status: Home / Self Care | Payer: BC Managed Care – PPO | Source: Ambulatory Visit | Attending: Obstetrics and Gynecology | Admitting: Obstetrics and Gynecology

## 2017-05-24 DIAGNOSIS — Z1231 Encounter for screening mammogram for malignant neoplasm of breast: Secondary | ICD-10-CM | POA: Insufficient documentation

## 2018-03-30 ENCOUNTER — Other Ambulatory Visit: Payer: Self-pay | Admitting: Obstetrics and Gynecology

## 2018-03-30 DIAGNOSIS — Z1231 Encounter for screening mammogram for malignant neoplasm of breast: Secondary | ICD-10-CM

## 2018-05-29 ENCOUNTER — Ambulatory Visit
Admission: RE | Admit: 2018-05-29 | Discharge: 2018-05-29 | Disposition: A | Discharge status: Home / Self Care | Payer: BC Managed Care – PPO | Source: Ambulatory Visit | Attending: Obstetrics and Gynecology | Admitting: Obstetrics and Gynecology

## 2018-05-29 DIAGNOSIS — Z1231 Encounter for screening mammogram for malignant neoplasm of breast: Secondary | ICD-10-CM | POA: Insufficient documentation

## 2018-10-15 ENCOUNTER — Emergency Department
Admission: EM | Admit: 2018-10-15 | Discharge: 2018-10-15 | Disposition: A | Discharge status: Home / Self Care | Payer: BC Managed Care – PPO | Attending: Emergency Medicine | Admitting: Emergency Medicine

## 2018-10-15 ENCOUNTER — Emergency Department: Payer: BC Managed Care – PPO

## 2018-10-15 ENCOUNTER — Other Ambulatory Visit: Payer: Self-pay

## 2018-10-15 DIAGNOSIS — R1012 Left upper quadrant pain: Secondary | ICD-10-CM

## 2018-10-15 DIAGNOSIS — R1011 Right upper quadrant pain: Secondary | ICD-10-CM | POA: Diagnosis not present

## 2018-10-15 DIAGNOSIS — Z87891 Personal history of nicotine dependence: Secondary | ICD-10-CM | POA: Diagnosis not present

## 2018-10-15 DIAGNOSIS — Z79899 Other long term (current) drug therapy: Secondary | ICD-10-CM | POA: Insufficient documentation

## 2018-10-15 LAB — URINALYSIS, COMPLETE (UACMP) WITH MICROSCOPIC
Bacteria, UA: NONE SEEN
Bilirubin Urine: NEGATIVE
Glucose, UA: NEGATIVE mg/dL
Ketones, ur: NEGATIVE mg/dL
Leukocytes,Ua: NEGATIVE
Nitrite: NEGATIVE
Protein, ur: 30 mg/dL — AB
Specific Gravity, Urine: 1.021 (ref 1.005–1.030)
pH: 5 (ref 5.0–8.0)

## 2018-10-15 LAB — CBC
HCT: 43.7 % (ref 36.0–46.0)
Hemoglobin: 13.8 g/dL (ref 12.0–15.0)
MCH: 27 pg (ref 26.0–34.0)
MCHC: 31.6 g/dL (ref 30.0–36.0)
MCV: 85.5 fL (ref 80.0–100.0)
Platelets: 261 10*3/uL (ref 150–400)
RBC: 5.11 MIL/uL (ref 3.87–5.11)
RDW: 17.5 % — ABNORMAL HIGH (ref 11.5–15.5)
WBC: 7.5 10*3/uL (ref 4.0–10.5)
nRBC: 0 % (ref 0.0–0.2)

## 2018-10-15 LAB — COMPREHENSIVE METABOLIC PANEL
ALT: 17 U/L (ref 0–44)
AST: 21 U/L (ref 15–41)
Albumin: 4.4 g/dL (ref 3.5–5.0)
Alkaline Phosphatase: 94 U/L (ref 38–126)
Anion gap: 8 (ref 5–15)
BUN: 13 mg/dL (ref 6–20)
CO2: 22 mmol/L (ref 22–32)
Calcium: 9.1 mg/dL (ref 8.9–10.3)
Chloride: 110 mmol/L (ref 98–111)
Creatinine, Ser: 0.81 mg/dL (ref 0.44–1.00)
GFR calc Af Amer: >60 mL/min (ref >60)
GFR calc non Af Amer: >60 mL/min (ref >60)
Glucose, Bld: 92 mg/dL (ref 70–99)
Potassium: 3.9 mmol/L (ref 3.5–5.1)
Sodium: 140 mmol/L (ref 135–145)
Total Bilirubin: 0.8 mg/dL (ref 0.3–1.2)
Total Protein: 7.7 g/dL (ref 6.5–8.1)

## 2018-10-15 LAB — LIPASE, BLOOD: Lipase: 33 U/L (ref 11–51)

## 2018-10-15 MED ORDER — DILTIAZEM HCL 100 MG IV SOLR: 5.0000 mg/h | INTRAVENOUS | Status: DC

## 2018-10-15 MED ORDER — IOHEXOL 300 MG/ML  SOLN
100.0000 mL | Freq: Once | INTRAMUSCULAR | Status: AC | PRN
  Administered 2018-10-15: 100 mL via INTRAVENOUS

## 2018-10-15 MED ORDER — LIDOCAINE 5 % EX PTCH
1.0000 | MEDICATED_PATCH | CUTANEOUS | Status: DC
  Administered 2018-10-15: 17:00:00 1 via TRANSDERMAL
  Filled 2018-10-15: qty 1

## 2018-10-15 MED ORDER — ACETAMINOPHEN 325 MG PO TABS
650.0000 mg | ORAL_TABLET | Freq: Once | ORAL | Status: DC
  Filled 2018-10-15: qty 2

## 2018-10-15 NOTE — ED Provider Notes (Signed)
[facility] Emergency Department Provider Note  ____________________________________________   First MD Initiated Contact with Patient 10/15/18 1608     (approximate)  I have reviewed the triage vital signs and the nursing notes.   HISTORY  Chief Complaint Abdominal Pain    HPI Mikayla Reed is a 53 y.o. female with prior hysterecomy who presents with abdominal pain.  Pt endorses having pain on the left upper abdomen that is severe, intermittent, feels like spasming sensation and worse with palpation and movement, better at rest.  She is however on a blood thinner warfarin for prior blood clot.  Denies any new shortness of breath or chest pain.  She does drink alcohol but denies history of withdrawal.  This is been going on for the past few days.  Is been getting somewhat better but she wanted to have it evaluated today.    Past Medical History:  Diagnosis Date  . Allergy   . Anxiety   . GERD (gastroesophageal reflux disease)   . H/O blood clots   . Liver cell damage   . Migraine   . Migraines   . UTI (lower urinary tract infection)     Patient Active Problem List   Diagnosis Date Noted  . Postoperative state 03/13/2015    Past Surgical History:  Procedure Laterality Date  . CYSTOSCOPY N/A 03/13/2015   Procedure: CYSTOSCOPY/URETHROSCOPY;  Surgeon: Benjaman Kindler, MD;  Location: [facility] ORS;  Service: Gynecology;  Laterality: N/A;  . IUD REMOVAL  03/13/2015   Procedure: INTRAUTERINE DEVICE (IUD) REMOVAL;  Surgeon: Benjaman Kindler, MD;  Location: [facility] ORS;  Service: Gynecology;;  . LAPAROSCOPIC VAGINAL HYSTERECTOMY WITH SALPINGO OOPHORECTOMY Bilateral 03/13/2015   Procedure: LAPAROSCOPIC ASSISTED VAGINAL HYSTERECTOMY WITH SALPINGO OOPHORECTOMY, TRIGGER POINT INJECTION;  Surgeon: Benjaman Kindler, MD;  Location: [facility] ORS;  Service: Gynecology;  Laterality: Bilateral;  . REPAIR VAGINAL CUFF N/A 03/19/2015   Procedure: REPAIR VAGINAL CUFF;   Surgeon: Benjaman Kindler, MD;  Location: [facility] ORS;  Service: Gynecology;  Laterality: N/A;  . VAGINAL HYSTERECTOMY  2016    Prior to Admission medications   Medication Sig Start Date End Date Taking? Authorizing Provider  ALPRAZolam (XANAX) 0.25 MG tablet Take 0.25 mg by mouth 2 (two) times daily as needed.  11/04/14   [provider]  buPROPion (WELLBUTRIN XL) 150 MG 24 hr tablet 1 PO daily 11/04/14   [provider]  cetirizine (ZYRTEC) 10 MG tablet Take by mouth.    [provider]  Cholecalciferol (VITAMIN D-1000 MAX ST) 1000 UNITS tablet Take by mouth.    [provider]  Cyanocobalamin (B-12 DOTS) 500 MCG TBDP Take by mouth.    [provider]  cyclobenzaprine (FLEXERIL) 5 MG tablet Take 5 mg by mouth daily as needed.  02/04/14   [provider]  docusate sodium (COLACE) 100 MG capsule Take 1 capsule (100 mg total) by mouth 2 (two) times daily. 03/14/15   Benjaman Kindler, MD  esomeprazole ([place]) 40 MG capsule Take by mouth. 02/09/15 02/09/16  [provider]  fluticasone (FLONASE) 50 MCG/ACT nasal spray PLACE 2 SPRAYS INTO BOTH NOSTRILS ONCE DAILY. 06/30/14   [provider]  HYDROcodone-acetaminophen (NORCO/VICODIN) 5-325 MG tablet Take 1-2 tablets by mouth every 6 (six) hours as needed for moderate pain or severe pain. 03/14/15   Benjaman Kindler, MD  ketoprofen (ORUDIS) 75 MG capsule Take by mouth. 02/04/14   [provider]  magnesium oxide (MAG-OX) 400 MG tablet Take by mouth.  [provider]  vitamin C (ASCORBIC ACID) 250 MG tablet Take 1 tablet (250 mg total) by mouth daily. Take with the iron (ferrous sulfate) to help absorption 03/14/15   Benjaman Kindler, MD  vitamin E 400 UNIT capsule Take by mouth.    [provider]    Allergies Sulfa antibiotics  Family History  Problem Relation Age of Onset  . Urolithiasis Neg Hx   . Prostate cancer Neg Hx   . Kidney disease Neg Hx    . Kidney cancer Neg Hx   . Breast cancer Neg Hx     Social History Social History   Tobacco Use  . Smoking status: Former Smoker    Quit date: 03/06/2015    Years since quitting: 3.6  Substance Use Topics  . Alcohol use: Yes    Alcohol/week: 0.0 standard drinks    Comment: occ  . Drug use: No      Review of Systems Constitutional: No fever/chills Eyes: No visual changes. ENT: No sore throat. Cardiovascular: Denies chest pain. Respiratory: Denies shortness of breath. Gastrointestinal: Positive abdominal pain no nausea, no vomiting.  No diarrhea.  No constipation. Genitourinary: Negative for dysuria. Musculoskeletal: Negative for back pain. Skin: Negative for rash. Neurological: Negative for headaches, focal weakness or numbness. All other ROS negative ____________________________________________   PHYSICAL EXAM:  VITAL SIGNS: ED Triage Vitals  Enc Vitals Group     BP 10/15/18 1436 (!) 156/100     Pulse Rate 10/15/18 1436 76     Resp 10/15/18 1436 20     Temp 10/15/18 1436 99.6 F (37.6 C)     Temp Source 10/15/18 1436 Oral     SpO2 10/15/18 1436 100 %     Weight 10/15/18 1437 175 lb (79.4 kg)     Height 10/15/18 1437 5' (1.524 m)     Head Circumference --      Peak Flow --      Pain Score 10/15/18 1437 6     Pain Loc --      Pain Edu? --      Excl. in [place]? --     Constitutional: Alert and oriented. Well appearing and in no acute distress. Eyes: Conjunctivae are normal. EOMI. Head: Atraumatic. Nose: No congestion/rhinnorhea. Mouth/Throat: Mucous membranes are moist.   Neck: No stridor. Trachea Midline. FROM Cardiovascular: Normal rate, regular rhythm. Grossly normal heart sounds.  Good peripheral circulation. Respiratory: Normal respiratory effort.  No retractions. Lungs CTAB. Gastrointestinal: Left upper abdominal pain.  No distention. No abdominal bruits.  Musculoskeletal: No lower extremity tenderness nor edema.  No joint effusions. Neurologic:   Normal speech and language. No gross focal neurologic deficits are appreciated.  Skin:  Skin is warm, dry and intact. No rash noted. Psychiatric: Mood and affect are normal. Speech and behavior are normal. GU: Deferred   ____________________________________________   LABS (all labs ordered are listed, but only abnormal results are displayed)  Labs Reviewed  CBC - Abnormal; Notable for the following components:      Result Value   RDW 17.5 (*)    All other components within normal limits  URINALYSIS, COMPLETE (UACMP) WITH MICROSCOPIC - Abnormal; Notable for the following components:   Color, Urine YELLOW (*)    APPearance HAZY (*)    Hgb urine dipstick SMALL (*)    Protein, ur 30 (*)    All other components within normal limits  LIPASE, BLOOD  COMPREHENSIVE METABOLIC PANEL  PROTIME-INR  APTT   ____________________________________________ ____________________________________________  RADIOLOGY   Official radiology report(s): Ct Abdomen Pelvis W Contrast  Result Date: 10/15/2018 CLINICAL DATA:  LEFT upper abdominal pain for few days. EXAM: CT ABDOMEN AND PELVIS WITH CONTRAST TECHNIQUE: Multidetector CT imaging of the abdomen and pelvis was performed using the standard protocol following bolus administration of intravenous contrast. CONTRAST:  180mL OMNIPAQUE IOHEXOL 300 MG/ML  SOLN COMPARISON:  None. FINDINGS: Lower chest: No acute abnormality. Hepatobiliary: No focal liver abnormality is seen. Stable atrophy of the LEFT liver lobe. Gallbladder appears normal. No bile duct dilatation. Pancreas: Unremarkable. No pancreatic ductal dilatation or surrounding inflammatory changes. Spleen: Normal in size without focal abnormality. Adrenals/Urinary Tract: Adrenal glands appear normal. Kidneys appear normal without mass, stone or hydronephrosis. No perinephric fluid. No ureteral or bladder calculi identified. Bladder is unremarkable, partially decompressed. Stomach/Bowel: No dilated large or  small bowel loops. No evidence of bowel wall inflammation. Appendix is normal. Stomach is unremarkable, partially decompressed. Vascular/Lymphatic: Aortic atherosclerosis. No enlarged abdominal or pelvic lymph nodes. Reproductive: Status post hysterectomy. No adnexal masses. Other: No free fluid or abscess collections seen. No free intraperitoneal air. Musculoskeletal: Degenerative spondylosis of the lower lumbar spine, moderate to severe in degree at the L5-S1 level with suspected nerve root impingement. No acute appearing osseous abnormality. IMPRESSION: 1. No acute findings within the abdomen or pelvis. No source for left upper quadrant pain identified. No bowel obstruction or evidence of bowel wall inflammation. No evidence of acute solid organ abnormality. No renal or ureteral calculi. No free fluid or inflammatory change. 2. Degenerative spondylosis of the lower lumbar spine, moderate to severe in degree at the L5-S1 level with suspected nerve root impingement. If any radiculopathic symptoms, would consider nonemergent lumbar spine MRI for further characterization. Aortic Atherosclerosis (ICD10-I70.0). Electronically Signed   By: Franki Cabot M.D.   On: 10/15/2018 18:01    ____________________________________________   PROCEDURES  Procedure(s) performed (including Critical Care):  Procedures   ____________________________________________   INITIAL IMPRESSION / ASSESSMENT AND PLAN / ED COURSE  KYMARI LOLLIS was evaluated in Emergency Department on 10/15/2018 for the symptoms described in the history of present illness. She was evaluated in the context of the global COVID-19 pandemic, which necessitated consideration that the patient might be at risk for infection with the SARS-CoV-2 virus that causes COVID-19. Institutional protocols and algorithms that pertain to the evaluation of patients at risk for COVID-19 are in a state of rapid change based on information released by regulatory  bodies including the CDC and federal and state organizations. These policies and algorithms were followed during the patient's care in the ED.    Patient has what sounds like musculoskeletal abdominal pain however given patient is on warfarin will get CT scan to rule out retroperitoneal bleed.  I also discussed with patient she felt more comfortable with getting a CT scan.  Also get labs to evaluate for pancreatitis, cholecystitis, UTI. No chest pain to suggest ACS.  No sob to suggest PE. No cough, fevers to suggest PNA.    Patient noted to have a normal lipase at 33.  No evidence of anemia.  No elevation in her white count.  Urine is without evidence of UTI.  CT scan negative for acute pathology.  Patient does have some L5-S1 spondylosis with possible nerve root impingement.  Patient denies any symptoms suggest cord compression.  Patient will follow this up with her primary care doctor.  Patient's INR has not been taken yet.  Patient denies any bleeding and says that her INR  has been stable for months.  Her next check is in a week.  She is okay with not staying for INR check.   I discussed the provisional nature of ED diagnosis, the treatment so far, the ongoing plan of care, follow up appointments and return precautions with the patient and any family or support people present. They expressed understanding and agreed with the plan, discharged home.  ____________________________________________   FINAL CLINICAL IMPRESSION(S) / ED DIAGNOSES   Final diagnoses:  Left upper quadrant pain      MEDICATIONS GIVEN DURING THIS VISIT:  Medications  lidocaine (LIDODERM) 5 % 1 patch (1 patch Transdermal Patch Applied 10/15/18 1717)  acetaminophen (TYLENOL) tablet 650 mg (650 mg Oral Not Given 10/15/18 1717)  iohexol (OMNIPAQUE) 300 MG/ML solution 100 mL (100 mLs Intravenous Contrast Given 10/15/18 1738)     ED Discharge Orders    None       Note:  This document was prepared using Dragon  voice recognition software and may include unintentional dictation errors.   Vanessa [place], MD 10/15/18 Bosie Helper

## 2018-10-15 NOTE — ED Triage Notes (Signed)
Pt reports pain to upper left abd for a few days. Pt states when she moved the pain gets worse. Pt denies injuries. Pt states when she pushes on the area it hurts worse and she feels like something is swollen.

## 2018-10-15 NOTE — ED Notes (Signed)
This RN to room 12; pt not present in room.

## 2018-10-15 NOTE — Discharge Instructions (Signed)
Patient follow-up with your primary care doctor about the results below.  Return to the ER for fevers, worsening shortness of breath or any other concerns.  1. No acute findings within the abdomen or pelvis. No source for left upper quadrant pain identified. No bowel obstruction or evidence of bowel wall inflammation. No evidence of acute solid organ abnormality. No renal or ureteral calculi. No free fluid or inflammatory change. 2. Degenerative spondylosis of the lower lumbar spine, moderate to severe in degree at the L5-S1 level with suspected nerve root impingement. If any radiculopathic symptoms, would consider nonemergent lumbar spine MRI for further characterization. Aortic Atherosclerosis (ICD10-I70.0). Electronically Signed   By: Franki Cabot M.D.   On: 10/15/2018 18:01

## 2019-04-11 ENCOUNTER — Other Ambulatory Visit: Payer: Self-pay | Admitting: Obstetrics and Gynecology

## 2019-04-11 DIAGNOSIS — Z1231 Encounter for screening mammogram for malignant neoplasm of breast: Secondary | ICD-10-CM

## 2019-05-30 ENCOUNTER — Ambulatory Visit
Admission: RE | Admit: 2019-05-30 | Discharge: 2019-05-30 | Disposition: A | Discharge status: Home / Self Care | Payer: BC Managed Care – PPO | Source: Ambulatory Visit | Attending: Obstetrics and Gynecology | Admitting: Obstetrics and Gynecology

## 2019-05-30 DIAGNOSIS — Z1231 Encounter for screening mammogram for malignant neoplasm of breast: Secondary | ICD-10-CM | POA: Diagnosis not present

## 2019-07-17 ENCOUNTER — Other Ambulatory Visit
Admission: RE | Admit: 2019-07-17 | Discharge: 2019-07-17 | Disposition: A | Discharge status: Home / Self Care | Payer: BC Managed Care – PPO | Source: Ambulatory Visit | Attending: General Surgery | Admitting: General Surgery

## 2019-07-17 ENCOUNTER — Other Ambulatory Visit: Payer: Self-pay

## 2019-07-17 DIAGNOSIS — Z20822 Contact with and (suspected) exposure to covid-19: Secondary | ICD-10-CM | POA: Insufficient documentation

## 2019-07-17 DIAGNOSIS — Z01812 Encounter for preprocedural laboratory examination: Secondary | ICD-10-CM | POA: Diagnosis present

## 2019-07-17 LAB — SARS CORONAVIRUS 2 (TAT 6-24 HRS): SARS Coronavirus 2: NEGATIVE

## 2019-07-18 ENCOUNTER — Encounter: Payer: Self-pay | Admitting: General Surgery

## 2019-07-19 ENCOUNTER — Encounter
Admission: RE | Disposition: A | Discharge status: Home / Self Care | Payer: Self-pay | Source: Home / Self Care | Attending: General Surgery

## 2019-07-19 ENCOUNTER — Other Ambulatory Visit: Payer: Self-pay

## 2019-07-19 ENCOUNTER — Ambulatory Visit: Payer: BC Managed Care – PPO | Admitting: Certified Registered Nurse Anesthetist

## 2019-07-19 ENCOUNTER — Ambulatory Visit
Admission: RE | Admit: 2019-07-19 | Discharge: 2019-07-19 | Disposition: A | Discharge status: Home / Self Care | Payer: BC Managed Care – PPO | Attending: General Surgery | Admitting: General Surgery

## 2019-07-19 ENCOUNTER — Encounter: Payer: Self-pay | Admitting: General Surgery

## 2019-07-19 DIAGNOSIS — G43909 Migraine, unspecified, not intractable, without status migrainosus: Secondary | ICD-10-CM | POA: Insufficient documentation

## 2019-07-19 DIAGNOSIS — K219 Gastro-esophageal reflux disease without esophagitis: Secondary | ICD-10-CM | POA: Insufficient documentation

## 2019-07-19 DIAGNOSIS — E785 Hyperlipidemia, unspecified: Secondary | ICD-10-CM | POA: Insufficient documentation

## 2019-07-19 DIAGNOSIS — I10 Essential (primary) hypertension: Secondary | ICD-10-CM | POA: Diagnosis not present

## 2019-07-19 DIAGNOSIS — Z87891 Personal history of nicotine dependence: Secondary | ICD-10-CM | POA: Diagnosis not present

## 2019-07-19 DIAGNOSIS — Z86718 Personal history of other venous thrombosis and embolism: Secondary | ICD-10-CM | POA: Insufficient documentation

## 2019-07-19 DIAGNOSIS — J45909 Unspecified asthma, uncomplicated: Secondary | ICD-10-CM | POA: Insufficient documentation

## 2019-07-19 DIAGNOSIS — Z7901 Long term (current) use of anticoagulants: Secondary | ICD-10-CM | POA: Diagnosis not present

## 2019-07-19 DIAGNOSIS — Z882 Allergy status to sulfonamides status: Secondary | ICD-10-CM | POA: Diagnosis not present

## 2019-07-19 DIAGNOSIS — Z1211 Encounter for screening for malignant neoplasm of colon: Secondary | ICD-10-CM | POA: Diagnosis present

## 2019-07-19 DIAGNOSIS — Z79899 Other long term (current) drug therapy: Secondary | ICD-10-CM | POA: Insufficient documentation

## 2019-07-19 DIAGNOSIS — F419 Anxiety disorder, unspecified: Secondary | ICD-10-CM | POA: Diagnosis not present

## 2019-07-19 DIAGNOSIS — I700 Atherosclerosis of aorta: Secondary | ICD-10-CM | POA: Diagnosis not present

## 2019-07-19 HISTORY — DX: Unspecified asthma, uncomplicated: J45.909

## 2019-07-19 HISTORY — DX: Hyperlipidemia, unspecified: E78.5

## 2019-07-19 HISTORY — DX: Essential (primary) hypertension: I10

## 2019-07-19 HISTORY — DX: Atherosclerosis of aorta: I70.0

## 2019-07-19 HISTORY — PX: COLONOSCOPY WITH PROPOFOL: SHX5780

## 2019-07-19 SURGERY — COLONOSCOPY WITH PROPOFOL
Anesthesia: General

## 2019-07-19 MED ORDER — LIDOCAINE HCL (CARDIAC) PF 100 MG/5ML IV SOSY
PREFILLED_SYRINGE | INTRAVENOUS | Status: DC | PRN
  Administered 2019-07-19: 50 mg via INTRAVENOUS

## 2019-07-19 MED ORDER — PROPOFOL 500 MG/50ML IV EMUL
INTRAVENOUS | Status: DC | PRN
  Administered 2019-07-19: 150 ug/kg/min via INTRAVENOUS

## 2019-07-19 MED ORDER — PROPOFOL 10 MG/ML IV BOLUS
INTRAVENOUS | Status: DC | PRN
  Administered 2019-07-19: 70 mg via INTRAVENOUS

## 2019-07-19 MED ORDER — LIDOCAINE HCL (PF) 2 % IJ SOLN
INTRAMUSCULAR | Status: AC
  Filled 2019-07-19: qty 5

## 2019-07-19 MED ORDER — SODIUM CHLORIDE 0.9 % IV SOLN: INTRAVENOUS | Status: DC

## 2019-07-19 MED ORDER — PROPOFOL 500 MG/50ML IV EMUL
INTRAVENOUS | Status: AC
  Filled 2019-07-19: qty 50

## 2019-07-19 NOTE — Transfer of Care (Signed)
Immediate Anesthesia Transfer of Care Note  Patient: Mikayla Reed  Procedure(s) Performed: COLONOSCOPY WITH PROPOFOL (N/A )  Patient Location: PACU  Anesthesia Type:General  Level of Consciousness: drowsy  Airway & Oxygen Therapy: Patient Spontanous Breathing  Post-op Assessment: Report given to RN and Post -op Vital signs reviewed and stable  Post vital signs: Reviewed and stable  Last Vitals:  Vitals Value Taken Time  BP 113/68 07/19/19 1037  Temp    Pulse 70 07/19/19 1038  Resp 14 07/19/19 1038  SpO2 95 % 07/19/19 1038  Vitals shown include unvalidated device data.  Last Pain:  Vitals:   07/19/19 0905  TempSrc: Temporal  PainSc: 0-No pain         Complications: No apparent anesthesia complications

## 2019-07-19 NOTE — Anesthesia Preprocedure Evaluation (Signed)
Anesthesia Evaluation  Patient identified by MRN, date of birth, ID band Patient awake    Reviewed: Allergy & Precautions, NPO status , Patient's Chart, lab work & pertinent test results  History of Anesthesia Complications Negative for: history of anesthetic complications  Airway Mallampati: I       Dental   Pulmonary neg sleep apnea, neg COPD, Current Smoker (quit x 1 week), former smoker,           Cardiovascular hypertension, Pt. on medications + DVT  (-) Past MI and (-) CHF (-) dysrhythmias (-) Valvular Problems/Murmurs     Neuro/Psych neg Seizures Anxiety    GI/Hepatic Neg liver ROS, GERD  Controlled,  Endo/Other  neg diabetes  Renal/GU negative Renal ROS     Musculoskeletal   Abdominal   Peds  Hematology   Anesthesia Other Findings   Reproductive/Obstetrics                             Anesthesia Physical Anesthesia Plan  ASA: II  Anesthesia Plan: General   Post-op Pain Management:    Induction: Intravenous  PONV Risk Score and Plan: Propofol infusion and TIVA  Airway Management Planned: Nasal Cannula  Additional Equipment:   Intra-op Plan:   Post-operative Plan:   Informed Consent: I have reviewed the patients History and Physical, chart, labs and discussed the procedure including the risks, benefits and alternatives for the proposed anesthesia with the patient or authorized representative who has indicated his/her understanding and acceptance.       Plan Discussed with:   Anesthesia Plan Comments:         Anesthesia Quick Evaluation

## 2019-07-19 NOTE — Op Note (Signed)
[facility] Gastroenterology Patient Name: Mikayla Reed Procedure Date: 07/19/2019 10:08 AM MRN: [id number] Account #: [id number] Date of Birth: 1965/12/27 Admit Type: Outpatient Age: 54 Room: [facility] ENDO ROOM 1 Gender: Female Note Status: Finalized Procedure:             Colonoscopy Indications:           Screening for colorectal malignant neoplasm Providers:             Robert Bellow, MD Referring MD:          Caprice Renshaw MD (Referring MD) Medicines:             Monitored Anesthesia Care Complications:         No immediate complications. Procedure:             Pre-Anesthesia Assessment:                        - Prior to the procedure, a History and Physical was                         performed, and patient medications, allergies and                         sensitivities were reviewed. The patient's tolerance                         of previous anesthesia was reviewed.                        - The risks and benefits of the procedure and the                         sedation options and risks were discussed with the                         patient. All questions were answered and informed                         consent was obtained.                        After obtaining informed consent, the colonoscope was                         passed under direct vision. Throughout the procedure,                         the patient's blood pressure, pulse, and oxygen                         saturations were monitored continuously. The                         Colonoscope was introduced through the anus and                         advanced to the the cecum, identified by appendiceal                         orifice and  ileocecal valve. The colonoscopy was                         performed without difficulty. The patient tolerated                         the procedure well. The quality of the bowel                         preparation was excellent. Findings:  The entire examined colon appeared normal on direct and retroflexion       views. Impression:            - The entire examined colon is normal on direct and                         retroflexion views.                        - No specimens collected. Recommendation:        - Repeat colonoscopy in 10 years for screening                         purposes. Procedure Code(s):     --- Professional ---                        [id number], Colonoscopy, flexible; diagnostic, including                         collection of specimen(s) by brushing or washing, when                         performed (separate procedure) Diagnosis Code(s):     --- Professional ---                        Z12.11, Encounter for screening for malignant neoplasm                         of colon CPT copyright 2019 [employer]. All rights reserved. The codes documented in this report are preliminary and upon coder review may  be revised to meet current compliance requirements. Robert Bellow, MD 07/19/2019 10:35:33 AM This report has been signed electronically. Number of Addenda: 0 Note Initiated On: 07/19/2019 10:08 AM Scope Withdrawal Time: 0 hours 8 minutes 40 seconds  Total Procedure Duration: 0 hours 15 minutes 24 seconds       [facility]

## 2019-07-19 NOTE — Anesthesia Postprocedure Evaluation (Signed)
Anesthesia Post Note  Patient: Mikayla Reed  Procedure(s) Performed: COLONOSCOPY WITH PROPOFOL (N/A )  Patient location during evaluation: Endoscopy Anesthesia Type: General Level of consciousness: awake and alert Pain management: pain level controlled Vital Signs Assessment: post-procedure vital signs reviewed and stable Respiratory status: spontaneous breathing and respiratory function stable Cardiovascular status: stable Anesthetic complications: no     Last Vitals:  Vitals:   07/19/19 0905 07/19/19 1036  BP: (!) 143/98 113/68  Pulse: 82   Resp: 18   Temp: (!) 36.2 C (!) 36.3 C  SpO2: 98%     Last Pain:  Vitals:   07/19/19 1036  TempSrc: Temporal  PainSc: Asleep                 Itai Barbian K

## 2019-07-19 NOTE — H&P (Signed)
Mikayla Reed ZI:[id number] 03-Feb-1966     HPI:  54 year old woman for screening colonoscopy. 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  She had not been advised to discontinue coumadin or initiate lovenox bridging therapy.  She tolerated the prep well. Last PT INR: 1.8.  (07/10/19)  Medications Prior to Admission  Medication Sig Dispense Refill Last Dose  . escitalopram (LEXAPRO) 10 MG tablet Take 10 mg by mouth daily.     . pravastatin (PRAVACHOL) 40 MG tablet Take 40 mg by mouth at bedtime.     . topiramate (TOPAMAX) 50 MG tablet Take 150 mg by mouth daily.     Marland Kitchen warfarin (COUMADIN) 5 MG tablet Take 7.5 mg by mouth daily. Take1 1/2 tablets.   07/18/2019 at Unknown time  . ALPRAZolam (XANAX) 0.25 MG tablet Take 0.25 mg by mouth 2 (two) times daily as needed.      Marland Kitchen buPROPion (WELLBUTRIN XL) 150 MG 24 hr tablet 1 PO daily     . cetirizine (ZYRTEC) 10 MG tablet Take by mouth.     . Cholecalciferol (VITAMIN D-1000 MAX ST) 1000 UNITS tablet Take by mouth.     . Cyanocobalamin (B-12 DOTS) 500 MCG TBDP Take by mouth.     . cyclobenzaprine (FLEXERIL) 5 MG tablet Take 5 mg by mouth daily as needed.      . docusate sodium (COLACE) 100 MG capsule Take 1 capsule (100 mg total) by mouth 2 (two) times daily. 10 capsule 0   . esomeprazole (NEXIUM) 40 MG capsule Take by mouth.     . fluticasone (FLONASE) 50 MCG/ACT nasal spray PLACE 2 SPRAYS INTO BOTH NOSTRILS ONCE DAILY.     Marland Kitchen HYDROcodone-acetaminophen (NORCO/VICODIN) 5-325 MG tablet Take 1-2 tablets by mouth every 6 (six) hours as needed for moderate pain or severe pain. 30 tablet 0   . ketoprofen (ORUDIS) 75 MG capsule Take by mouth.     . magnesium oxide (MAG-OX) 400 MG tablet Take by mouth.     . vitamin C (ASCORBIC ACID) 250 MG tablet Take 1 tablet (250 mg total) by mouth daily. Take with the iron (ferrous sulfate) to help absorption 60 tablet 2   . vitamin E 400 UNIT capsule Take by mouth.       Allergies  Allergen Reactions  . Sulfa Antibiotics Hives   Past Medical History:  Diagnosis Date  . Allergy   . Anxiety   . Aortic atherosclerosis ([place])   . Asthma   . GERD (gastroesophageal reflux disease)   . H/O blood clots   . Hyperlipidemia   . Hypertension   . Liver cell damage   . Migraine   . Migraines   . UTI (lower urinary tract infection)    Past Surgical History:  Procedure Laterality Date  . CYSTOSCOPY N/A 03/13/2015   Procedure: CYSTOSCOPY/URETHROSCOPY;  Surgeon: Benjaman Kindler, MD;  Location: [facility];  Service: Gynecology;  Laterality: N/A;  . IUD REMOVAL  03/13/2015   Procedure: INTRAUTERINE DEVICE (IUD) REMOVAL;  Surgeon: Benjaman Kindler, MD;  Location: [facility];  Service: Gynecology;;  . LAPAROSCOPIC VAGINAL HYSTERECTOMY WITH SALPINGO OOPHORECTOMY Bilateral 03/13/2015   Procedure: LAPAROSCOPIC ASSISTED VAGINAL HYSTERECTOMY WITH SALPINGO OOPHORECTOMY, TRIGGER POINT INJECTION;  Surgeon: Benjaman Kindler, MD;  Location: [facility];  Service: Gynecology;  Laterality: Bilateral;  . REPAIR VAGINAL CUFF N/A 03/19/2015   Procedure: REPAIR VAGINAL CUFF;  Surgeon: Benjaman Kindler, MD;  Location: [facility];  Service: Gynecology;  Laterality:  N/A;  . VAGINAL HYSTERECTOMY  2016   Social History   Socioeconomic History  . Marital status: Married    Spouse name: Not on file  . Number of children: Not on file  . Years of education: Not on file  . Highest education level: Not on file  Occupational History  . Not on file  Tobacco Use  . Smoking status: Former Smoker    Quit date: 03/06/2015    Years since quitting: 4.3  Substance and Sexual Activity  . Alcohol use: Yes    Alcohol/week: 0.0 standard drinks    Comment: occ  . Drug use: No  . Sexual activity: Not on file  Other Topics Concern  . Not on file  Social History Narrative  . Not on file   Social Determinants of Health   Financial Resource Strain:   . Difficulty of Paying Living Expenses:   Food  Insecurity:   . Worried About Charity fundraiser in the Last Year:   . Arboriculturist in the Last Year:   Transportation Needs:   . Film/video editor (Medical):   Marland Kitchen Lack of Transportation (Non-Medical):   Physical Activity:   . Days of Exercise per Week:   . Minutes of Exercise per Session:   Stress:   . Feeling of Stress :   Social Connections:   . Frequency of Communication with Friends and Family:   . Frequency of Social Gatherings with Friends and Family:   . Attends Religious Services:   . Active Member of Clubs or Organizations:   . Attends Archivist Meetings:   Marland Kitchen Marital Status:   Intimate Partner Violence:   . Fear of Current or Ex-Partner:   . Emotionally Abused:   Marland Kitchen Physically Abused:   . Sexually Abused:    Social History   Social History Narrative  . Not on file     ROS: Negative.     PE: HEENT: Negative. Lungs: Clear. Cardio: RR.  Assessment/Plan:  Proceed with planned endoscopy. I discussed with the patient the increased risk of bleeding when completing a colonoscopy on coumadin. If a large polyp is identified, she will likely need a repeat exam with bridging therapy.     [facility] 07/19/2019

## 2019-07-22 ENCOUNTER — Encounter: Payer: Self-pay | Admitting: *Deleted

## 2020-06-11 ENCOUNTER — Other Ambulatory Visit: Payer: Self-pay | Admitting: Obstetrics and Gynecology

## 2020-06-11 DIAGNOSIS — Z1231 Encounter for screening mammogram for malignant neoplasm of breast: Secondary | ICD-10-CM

## 2020-06-26 ENCOUNTER — Ambulatory Visit
Admission: RE | Admit: 2020-06-26 | Discharge: 2020-06-26 | Disposition: A | Discharge status: Home / Self Care | Payer: BC Managed Care – PPO | Source: Ambulatory Visit | Attending: Obstetrics and Gynecology | Admitting: Obstetrics and Gynecology

## 2020-06-26 ENCOUNTER — Other Ambulatory Visit: Payer: Self-pay

## 2020-06-26 DIAGNOSIS — Z1231 Encounter for screening mammogram for malignant neoplasm of breast: Secondary | ICD-10-CM | POA: Insufficient documentation

## 2021-06-22 ENCOUNTER — Other Ambulatory Visit: Payer: Self-pay | Admitting: Obstetrics and Gynecology

## 2021-06-22 DIAGNOSIS — Z1231 Encounter for screening mammogram for malignant neoplasm of breast: Secondary | ICD-10-CM

## 2021-07-28 ENCOUNTER — Ambulatory Visit
Admission: RE | Admit: 2021-07-28 | Discharge: 2021-07-28 | Disposition: A | Discharge status: Home / Self Care | Payer: BC Managed Care – PPO | Source: Ambulatory Visit | Attending: Obstetrics and Gynecology | Admitting: Obstetrics and Gynecology

## 2021-07-28 DIAGNOSIS — Z1231 Encounter for screening mammogram for malignant neoplasm of breast: Secondary | ICD-10-CM | POA: Diagnosis present

## 2023-11-22 ENCOUNTER — Ambulatory Visit: Payer: Self-pay

## 2023-11-22 DIAGNOSIS — L814 Other melanin hyperpigmentation: Secondary | ICD-10-CM

## 2023-11-22 DIAGNOSIS — Z1283 Encounter for screening for malignant neoplasm of skin: Secondary | ICD-10-CM | POA: Diagnosis not present

## 2023-11-22 DIAGNOSIS — D1801 Hemangioma of skin and subcutaneous tissue: Secondary | ICD-10-CM

## 2023-11-22 DIAGNOSIS — L821 Other seborrheic keratosis: Secondary | ICD-10-CM | POA: Diagnosis not present

## 2023-11-22 DIAGNOSIS — W908XXA Exposure to other nonionizing radiation, initial encounter: Secondary | ICD-10-CM

## 2023-11-22 DIAGNOSIS — L570 Actinic keratosis: Secondary | ICD-10-CM

## 2023-11-22 DIAGNOSIS — D229 Melanocytic nevi, unspecified: Secondary | ICD-10-CM

## 2023-11-22 DIAGNOSIS — L578 Other skin changes due to chronic exposure to nonionizing radiation: Secondary | ICD-10-CM

## 2023-11-22 DIAGNOSIS — Z808 Family history of malignant neoplasm of other organs or systems: Secondary | ICD-10-CM

## 2023-11-22 DIAGNOSIS — I781 Nevus, non-neoplastic: Secondary | ICD-10-CM

## 2023-11-22 DIAGNOSIS — L820 Inflamed seborrheic keratosis: Secondary | ICD-10-CM | POA: Diagnosis not present

## 2023-11-22 NOTE — Patient Instructions (Addendum)

## 2023-11-22 NOTE — Progress Notes (Signed)
 New Patient Visit   Subjective  Mikayla Reed is a 58 y.o. female who presents for the following: Place at nose present for about 4-5 years has been monitoring it, does have dip now and place at right upper back sometimes gets itchy and irritated. No personal hx of skin cancer, family hx of skin cancer in maternal grandfather.   The following portions of the chart were reviewed this encounter and updated as appropriate: medications, allergies, medical history  Review of Systems:  No other skin or systemic complaints except as noted in HPI or Assessment and Plan.  Objective  Well appearing patient in no apparent distress; mood and affect are within normal limits.  A focused examination was performed of the following areas: Back, face, trunk, upper and lower extremities - sun exposed   - Angioma(s): Scattered red vascular papule(s) on the trunk - Lentigo/lentigines: Scattered pigmented macules that are tan to brown in color and are somewhat non-uniform in shape and concentrated in the sun-exposed areas of the trunk and upper extremities - Nevus/nevi: Scattered well-demarcated, regular, pigmented macule(s) and/or papule(s) on the scattered diffusely - Seborrheic Keratosis(es): Stuck-on appearing keratotic papule(s) on the trunk, some irritated with redness, crusting, edema, and/or partial avulsion - Actinic Elastosis: chronic sun damage: dyspigmentation, telangiectasia, and wrinkling - telangiectasias on nose, face    Relevant exam findings are noted in the Assessment and Plan.  Left Upper Back x1 Pink scaly macules  Assessment & Plan   FAMILY HISTORY OF SKIN CANCER What type(s): Unknown Who affected: Maternal grandfather  SKIN CANCER SCREENING PERFORMED TODAY.  BENIGN SKIN FINDINGS  - Lentigines  - Seborrheic keratoses  - Hemangiomas   - Nevus/Multiple Benign Nevi  - Telangiectasia  - Reassurance provided regarding the benign appearance of lesions noted on exam today;  no treatment is indicated in the absence of symptoms/changes. - Reinforced importance of photoprotective strategies including liberal and frequent sunscreen use of a broad-spectrum SPF 30 or greater, use of protective clothing, and sun avoidance for prevention of cutaneous malignancy and photoaging.  Counseled patient on the importance of regular self-skin monitoring as well as routine clinical skin examinations as scheduled.   ACTINIC DAMAGE - Chronic condition, secondary to cumulative UV/sun exposure - Recommend daily broad spectrum sunscreen SPF 30+ to sun-exposed areas, reapply every 2 hours as needed.  - Staying in the shade or wearing long sleeves, sun glasses (UVA+UVB protection) and wide brim hats (4-inch brim around the entire circumference of the hat) are also recommended for sun protection.  - Call for new or changing lesions.  Seborrheic keratosis, inflamed - Discussed diagnosis, typical course, and treatment options for this condition - Reassurance, benign, monitor - ABCDE's discussed - Given irritation and symptoms, will proceed with cryotherapy as below  Cryotherapy Procedure Note:  After R/B/A discussed (including scarring, pigment alteration, recurrence, or persistence of the lesion) and verbal consent was obtained, identified lesions were treated with liquid nitrogen x 1-2 ten second freeze-thaw cycle. The patient tolerated the procedure well and was instructed on post-procedure care. Total ISK(s) frozen: 1 Location and number: right upper back x1   AK (ACTINIC KERATOSIS) Left Upper Back x1 Actinic keratoses are precancerous spots that appear secondary to cumulative UV radiation exposure/sun exposure over time. They are chronic with expected duration over 1 year. A portion of actinic keratoses will progress to squamous cell carcinoma of the skin. It is not possible to reliably predict which spots will progress to skin cancer and so treatment is recommended  to prevent  development of skin cancer.  Recommend daily broad spectrum sunscreen SPF 30+ to sun-exposed areas, reapply every 2 hours as needed.  Recommend staying in the shade or wearing long sleeves, sun glasses (UVA+UVB protection) and wide brim hats (4-inch brim around the entire circumference of the hat). Call for new or changing lesions. Destruction of lesion - Left Upper Back x1 Complexity: simple   Destruction method: cryotherapy   Informed consent: discussed and consent obtained   Timeout:  patient name, date of birth, surgical site, and procedure verified Lesion destroyed using liquid nitrogen: Yes   Region frozen until ice ball extended beyond lesion: Yes   Cryo cycles: 1 or 2. Outcome: patient tolerated procedure well with no complications   Post-procedure details: wound care instructions given   Additional details:  Prior to procedure, discussed risks of blister formation, small wound, skin dyspigmentation, or rare scar following cryotherapy. Recommend Vaseline ointment to treated areas while healing.   SKIN EXAM FOR MALIGNANT NEOPLASM   LENTIGO   SEBORRHEIC KERATOSIS   CHERRY ANGIOMA   MULTIPLE BENIGN NEVI   ACTINIC SKIN DAMAGE   ACTINIC KERATOSIS   INFLAMED SEBORRHEIC KERATOSIS    Return in about 1 year (around 11/21/2024) for w/ Dr. Raymund, [facility].  I, Jacquelynn V. Wilfred, CMA, am acting as scribe for Lauraine JAYSON Raymund, MD .   Documentation: I have reviewed the above documentation for accuracy and completeness, and I agree with the above.  Lauraine JAYSON Raymund, MD
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
## 2024-04-01 ENCOUNTER — Ambulatory Visit
Admission: RE | Admit: 2024-04-01 | Discharge: 2024-04-01 | Disposition: A | Discharge status: Home / Self Care | Source: Ambulatory Visit | Attending: Physician Assistant | Admitting: Physician Assistant

## 2024-04-01 ENCOUNTER — Other Ambulatory Visit: Payer: Self-pay | Admitting: Physician Assistant

## 2024-04-01 DIAGNOSIS — S82842A Displaced bimalleolar fracture of left lower leg, initial encounter for closed fracture: Secondary | ICD-10-CM | POA: Insufficient documentation

## 2024-11-21 ENCOUNTER — Ambulatory Visit
